# Patient Record
Sex: Female | Born: 1975 | Race: White | Hispanic: No | Marital: Married | Smoking: Former smoker
Health system: Southern US, Community
[De-identification: ages and names within clinical notes are randomized; demographics above are authoritative.]

## PROBLEM LIST (undated history)

## (undated) DIAGNOSIS — H6590 Unspecified nonsuppurative otitis media, unspecified ear: Secondary | ICD-10-CM

## (undated) DIAGNOSIS — H7290 Unspecified perforation of tympanic membrane, unspecified ear: Secondary | ICD-10-CM

## (undated) DIAGNOSIS — J9819 Other pulmonary collapse: Secondary | ICD-10-CM

## (undated) DIAGNOSIS — K219 Gastro-esophageal reflux disease without esophagitis: Secondary | ICD-10-CM

## (undated) DIAGNOSIS — IMO0001 Reserved for inherently not codable concepts without codable children: Secondary | ICD-10-CM

## (undated) DIAGNOSIS — M329 Systemic lupus erythematosus, unspecified: Secondary | ICD-10-CM

## (undated) DIAGNOSIS — M1990 Unspecified osteoarthritis, unspecified site: Secondary | ICD-10-CM

## (undated) HISTORY — DX: Systemic lupus erythematosus, unspecified: M32.9

## (undated) HISTORY — DX: Unspecified nonsuppurative otitis media, unspecified ear: H65.90

## (undated) HISTORY — DX: Unspecified perforation of tympanic membrane, unspecified ear: H72.90

## (undated) HISTORY — DX: Other pulmonary collapse: J98.19

## (undated) HISTORY — DX: Unspecified osteoarthritis, unspecified site: M19.90

## (undated) HISTORY — DX: Gastro-esophageal reflux disease without esophagitis: K21.9

## (undated) HISTORY — PX: TUBAL LIGATION: SHX77

## (undated) HISTORY — PX: CHEST TUBE INSERTION: SHX231

## (undated) HISTORY — DX: Reserved for inherently not codable concepts without codable children: IMO0001

## (undated) HISTORY — PX: WRIST SURGERY: SHX841

---

## 1998-01-16 ENCOUNTER — Emergency Department (HOSPITAL_COMMUNITY): Admission: EM | Admit: 1998-01-16 | Discharge: 1998-01-16 | Payer: Self-pay | Admitting: Emergency Medicine

## 2002-01-16 ENCOUNTER — Encounter (HOSPITAL_COMMUNITY): Admission: RE | Admit: 2002-01-16 | Discharge: 2002-02-15 | Payer: Self-pay | Admitting: Rheumatology

## 2002-06-26 DIAGNOSIS — M329 Systemic lupus erythematosus, unspecified: Secondary | ICD-10-CM

## 2002-06-26 DIAGNOSIS — IMO0002 Reserved for concepts with insufficient information to code with codable children: Secondary | ICD-10-CM

## 2002-06-26 HISTORY — DX: Systemic lupus erythematosus, unspecified: M32.9

## 2002-06-26 HISTORY — DX: Reserved for concepts with insufficient information to code with codable children: IMO0002

## 2004-09-20 ENCOUNTER — Emergency Department (HOSPITAL_COMMUNITY): Admission: EM | Admit: 2004-09-20 | Discharge: 2004-09-20 | Payer: Self-pay | Admitting: *Deleted

## 2009-06-26 HISTORY — PX: LUNG SURGERY: SHX703

## 2009-06-26 HISTORY — PX: OTHER SURGICAL HISTORY: SHX169

## 2009-09-02 ENCOUNTER — Inpatient Hospital Stay (HOSPITAL_COMMUNITY): Admission: AD | Admit: 2009-09-02 | Discharge: 2009-09-09 | Payer: Self-pay | Admitting: Thoracic Surgery

## 2009-09-03 ENCOUNTER — Encounter: Payer: Self-pay | Admitting: Thoracic Surgery

## 2009-09-03 ENCOUNTER — Ambulatory Visit: Payer: Self-pay | Admitting: Thoracic Surgery

## 2009-09-06 ENCOUNTER — Ambulatory Visit: Payer: Self-pay | Admitting: Thoracic Surgery

## 2009-09-15 ENCOUNTER — Encounter: Admission: RE | Admit: 2009-09-15 | Discharge: 2009-09-15 | Payer: Self-pay | Admitting: Thoracic Surgery

## 2009-09-15 ENCOUNTER — Ambulatory Visit: Payer: Self-pay | Admitting: Thoracic Surgery

## 2009-10-06 ENCOUNTER — Encounter: Admission: RE | Admit: 2009-10-06 | Discharge: 2009-10-06 | Payer: Self-pay | Admitting: Thoracic Surgery

## 2009-10-06 ENCOUNTER — Ambulatory Visit: Payer: Self-pay | Admitting: Thoracic Surgery

## 2009-10-27 ENCOUNTER — Ambulatory Visit: Payer: Self-pay | Admitting: Thoracic Surgery

## 2009-10-27 ENCOUNTER — Encounter: Admission: RE | Admit: 2009-10-27 | Discharge: 2009-10-27 | Payer: Self-pay | Admitting: Thoracic Surgery

## 2010-09-19 LAB — CBC
HCT: 22.5 % — ABNORMAL LOW (ref 36.0–46.0)
HCT: 22.7 % — ABNORMAL LOW (ref 36.0–46.0)
HCT: 38.8 % (ref 36.0–46.0)
Hemoglobin: 13.2 g/dL (ref 12.0–15.0)
Hemoglobin: 8.3 g/dL — ABNORMAL LOW (ref 12.0–15.0)
MCHC: 34 g/dL (ref 30.0–36.0)
MCHC: 34.3 g/dL (ref 30.0–36.0)
MCHC: 34.8 g/dL (ref 30.0–36.0)
MCHC: 34.8 g/dL (ref 30.0–36.0)
MCV: 91.4 fL (ref 78.0–100.0)
MCV: 91.6 fL (ref 78.0–100.0)
MCV: 91.9 fL (ref 78.0–100.0)
MCV: 92.1 fL (ref 78.0–100.0)
Platelets: 226 10*3/uL (ref 150–400)
Platelets: 241 10*3/uL (ref 150–400)
Platelets: 330 10*3/uL (ref 150–400)
RBC: 2.45 MIL/uL — ABNORMAL LOW (ref 3.87–5.11)
RBC: 2.62 MIL/uL — ABNORMAL LOW (ref 3.87–5.11)
RBC: 3.15 MIL/uL — ABNORMAL LOW (ref 3.87–5.11)
RDW: 12.4 % (ref 11.5–15.5)
RDW: 13 % (ref 11.5–15.5)
RDW: 13.1 % (ref 11.5–15.5)
RDW: 13.4 % (ref 11.5–15.5)
WBC: 6.5 10*3/uL (ref 4.0–10.5)

## 2010-09-19 LAB — POCT I-STAT 3, ART BLOOD GAS (G3+)
Patient temperature: 98.4
TCO2: 29 mmol/L (ref 0–100)
pCO2 arterial: 47 mmHg — ABNORMAL HIGH (ref 35.0–45.0)
pH, Arterial: 7.375 (ref 7.350–7.400)

## 2010-09-19 LAB — BASIC METABOLIC PANEL
BUN: 2 mg/dL — ABNORMAL LOW (ref 6–23)
CO2: 28 mEq/L (ref 19–32)
CO2: 28 mEq/L (ref 19–32)
CO2: 31 mEq/L (ref 19–32)
Calcium: 7.6 mg/dL — ABNORMAL LOW (ref 8.4–10.5)
Chloride: 103 mEq/L (ref 96–112)
Chloride: 97 mEq/L (ref 96–112)
Creatinine, Ser: 0.55 mg/dL (ref 0.4–1.2)
Creatinine, Ser: 0.66 mg/dL (ref 0.4–1.2)
GFR calc Af Amer: >60 mL/min (ref >60)
GFR calc Af Amer: >60 mL/min (ref >60)
GFR calc non Af Amer: >60 mL/min (ref >60)
Glucose, Bld: 141 mg/dL — ABNORMAL HIGH (ref 70–99)
Potassium: 3.8 mEq/L (ref 3.5–5.1)
Potassium: 4 mEq/L (ref 3.5–5.1)
Sodium: 137 mEq/L (ref 135–145)

## 2010-09-19 LAB — COMPREHENSIVE METABOLIC PANEL
Albumin: 3.4 g/dL — ABNORMAL LOW (ref 3.5–5.2)
BUN: 7 mg/dL (ref 6–23)
Calcium: 8.7 mg/dL (ref 8.4–10.5)
Creatinine, Ser: 0.86 mg/dL (ref 0.4–1.2)
Glucose, Bld: 104 mg/dL — ABNORMAL HIGH (ref 70–99)
Potassium: 3.9 mEq/L (ref 3.5–5.1)
Sodium: 137 mEq/L (ref 135–145)
Total Protein: 7.1 g/dL (ref 6.0–8.3)

## 2010-09-19 LAB — TYPE AND SCREEN

## 2010-09-19 LAB — HEPATIC FUNCTION PANEL
AST: 31 U/L (ref 0–37)
Albumin: 2 g/dL — ABNORMAL LOW (ref 3.5–5.2)
Alkaline Phosphatase: 61 U/L (ref 39–117)
Total Protein: 4.9 g/dL — ABNORMAL LOW (ref 6.0–8.3)

## 2010-09-19 LAB — MRSA PCR SCREENING
MRSA by PCR: NEGATIVE
MRSA by PCR: NEGATIVE

## 2010-09-19 LAB — PROTIME-INR: Prothrombin Time: 13.7 seconds (ref 11.6–15.2)

## 2010-11-08 NOTE — Assessment & Plan Note (Signed)
OFFICE VISIT   CHAVA, DULAC  DOB:  31-Jul-1975                                        Oct 27, 2009  CHART #:  [id number]   HISTORY:  The patient is a 35 year old white female who was admitted in  March 2011 with a spontaneous recurrent left-sided pneumothorax.  She  subsequently underwent a left video-assisted thorascopic surgery with  resection of apical bullae, pleurectomy, and mechanical pleurodesis.  She is seen on today's date in routine office visit followup.  Currently, she describes no significant new symptoms.  She has stopped  smoking as of this hospitalization and surgery.  She does occasionally  have cravings, but so far has resisted.  She denies any significant  shortness of breath.  Overall, she feels as though she is making good  progress.   DIAGNOSTIC TESTS:  A chest x-ray was obtained on today's date.  It  reveals a small area of left-sided apical hydropneumothorax probably  most consistent with the space.  There is no evidence of infiltrates,  effusions, or congestive failure.   PHYSICAL EXAMINATION:  Vital Signs:  Blood pressure is 109/67, pulse is  84, respirations 18, and oxygen saturation is 98% on room air.  General  Appearance:  A well-developed, young adult female, in no acute distress.  Pulmonary:  Clear breath sounds throughout.  Cardiac:  Regular rate and  rhythm without gallop or rub.  Incisions are inspected, well healed.  No  evidence of infection.   ASSESSMENT:  The patient continues to make excellent recovery.  She will  be seen at the office now on a p.r.n. basis.  I have encouraged her to  continue her efforts at smoking cessation, long-term.   Rowe Clack, P.A.-C.   Sherryll Burger  D:  10/27/2009  T:  10/27/2009  Job:  [id number]   cc:   Erskine Speed, M.D.  Donna Bernard, M.D.

## 2010-11-08 NOTE — Letter (Signed)
October 06, 2009   Donna Bernard, MD  [address]  [place], [place] [address]   Re:  Sierra Williams, Sierra Williams              DOB:  1976-03-23   Dear Dr. Gerda Diss:   I saw the patient back today, and her chest x-ray looks good and her  incisions are well healed.  We did an apical resection of apical bullae.  She is doing well overall.  Her blood pressure is 103/71, pulse 96,  respirations 18, sats were 99% on room.  I released her for full  activity, and we will see her back again in 6 weeks with a chest x-ray.   Ines Bloomer, M.D.  Electronically Signed   DPB/MEDQ  D:  10/06/2009  T:  10/07/2009  Job:  [id number]

## 2010-11-08 NOTE — Letter (Signed)
September 15, 2009   Donna Bernard, MD  [address]  [place], [place] [address]   Re:  AIJALON, DEMURO              DOB:  06/23/1976   Dear Brett Canales,   I saw the patient back today after we did a left VATS and resection of  apical blebs and pleurectomy.  Her incisions are well healed.  She is  having some moderate pain but this is improving.  Her blood pressure is  106/73, pulse 89, respirations 18, sats were 96%.  A chest x-ray showed  normal postoperative changes and lung expanded.  I will see her back  again in 3 weeks with a chest x-ray.   Ines Bloomer, M.D.  Electronically Signed   DPB/MEDQ  D:  09/15/2009  T:  09/16/2009  Job:  [id number]

## 2010-11-11 NOTE — Consult Note (Signed)
[facility]  Patient:    Sierra Williams, Sierra Williams Visit Number: [id number] MRN: [id number]          Service Type: RHE Location: [facility] Attending Physician:  Sierra Williams Dictated by:   Sierra Williams, M.D. Proc. Date: 01/16/02 Admit Date:  01/16/2002   CC:         Sierra Williams, M.D.  Sierra Williams, D.O.   Consultation Report  CHIEF COMPLAINT:  Joints swell and get stiff.  Dear Sierra Williams:  Thank you for this consultation. The patient is a 35 year old white female who reports that she has had problems with her joints for many years. Over the last year, she has worsened. Her main problem is pain in her knees. She describes this as a constant pain and occasionally they can swell. Her hands and feet will swell at night and her hands are quite stiff is she mows the yard holding on to the [employer]. Squatting also hurts her knees. Her ankles can be stiff. Her knees have actually hurt since the ninth grade when she was running in gym class. Walking can make them worse. She has a job where she walks, stands, and needs to squat throughout the day.  REVIEW OF SYSTEMS:  She denies fever, rashes, or significant weight loss. She did lose weight during the early part of her separation, but she has regained this weight. Her energy level is described as being okay and fair. There are frequent nights of tossing and turning and she can wake up unrested frequently. There has been no psoriasis, photosensitivity, oral ulcers, alopecia, pleurisy, or Raynauds. She has headache about once a week which can be "bad." There is no problem with diarrhea, constipation, blood, or mucus to the bowel movement. She denies chest pain, shortness of breath, neck, or back pain.  PAST MEDICAL/SURGICAL HISTORY:  No chronic illnesses. No surgeries.  CURRENT MEDICATIONS: 1. Birth control pill. 2. Aleve 220 mg when needed.  DRUG INTOLERANCES:  None.  FAMILY HISTORY:  Her father is 75 years of  age and is described as being in pretty good health. Her mother is 37 years of age and has problems with her thyroid and cholesterol.  SOCIAL HISTORY:  She has lived most of her life in [place]. She separated in September 2002. She has children ages 81 and 32 at home with her. She works at [employer]. She achieved a GED. She began smoking at about age 62 and smokes a pack of cigarettes a day. She has a few beers per month.  PHYSICAL EXAMINATION:  VITAL SIGNS:  Weight 127 pounds. Blood pressure 110/60, respirations 16, pulse 76.  GENERAL:  She is somewhat tired appearing.  SKIN:  Her skin already has some mild sallowness secondary to 10 years of smoking. She has slight acne.  HEENT:  Normal hair pattern. PERL/EOMI. Mouth clear with fairly good dentition.  NECK:  Slight upper cervical adenopathy. Normal palpable nontender thyroid.  LUNGS:  Clear.  HEART:  Regular, no murmur.  ABDOMEN:  Umbilicus ring. There is no tenderness or masses palpated.  MUSCULOSKELETAL:  Hands, wrists, elbows, shoulders, neck, back, hips, knees, ankles, and feet all have a painless full range of motion. They show no synovitis, particularly the knees. There is no joint line tenderness and the stability was excellent.  NEUROLOGIC:  Strength is 5/5. DTRs are 2+ throughout. Negative SLRs.  ASSESSMENT AND PLAN: 1. Polyarthralgia. This is not a true arthritic problem. She aches in    these joints. I suspect  that the long hours of standing and squatting    do contribute to the knees aching. It is difficult to give one medicine    to help with this. If someone is particularly strong through exercise such    as bicycling, running, then they are able to better withstand this    type of work. This will be hard for her because I suspect that she has    never exercised but I have recommended this for her. 2. Mild to moderate insomnia. I have suggested using Tylenol p.m. Her    trigger points were not  overly tender. There is some slight suggestion    this could be a fibromyalgia process. Other medicines typically used    such as Flexeril, Elavil, Trazodone certainly could be tried on a p.r.n.    basis. 3. Smoking. I have encouraged her to quit and strategies were discussed.  Sierra Williams, the patient has a normal musculoskeletal exam. I have stated to her that I do not feel that she needs x-rays at this time because I suspect hands and knees and feet x-rays would be normal. This is an aching process. It goes along with long hours of working along with deconditioning and the gradual insult that smoking brings on. This is not a full-blown fibromyalgia process but has tendencies towards that. If her symptoms worsen, then I will be glad to re-evaluate her and she will return on a p.r.n. basis. Dictated by:   Sierra Williams, M.D. Attending Physician:  Sierra Williams DD:  01/16/02 TD:  01/20/02 Job: [id number]

## 2011-05-29 IMAGING — CT CT CHEST W/ CM
2 of 4 series · 15 of 36 positions shown, 18 images · IV contrast (80ml omni 300)
Comparison: None.

CLINICAL DATA: Pneumothorax.  Chest pain.

CT CHEST WITH CONTRAST
TECHNIQUE: Multidetector CT imaging of the chest was performed
following the standard protocol during bolus administration of
intravenous contrast.
Contrast: 80 ml [id number]

[Series 2: routine chest · axial · 0.61mm/px · z∈[-246,-0]mm · 12 of 59 slices shown, 15 images]
[im 5/59  mediastinal]
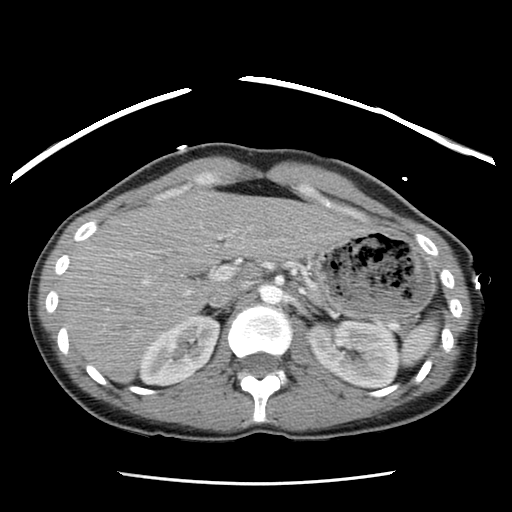
[im 5/59  lung]
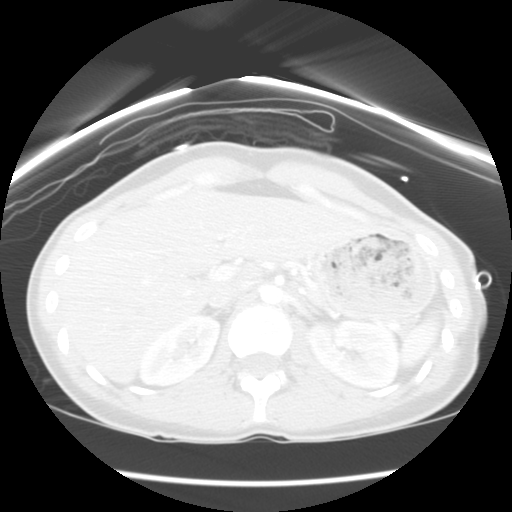
[im 9/59  lung]
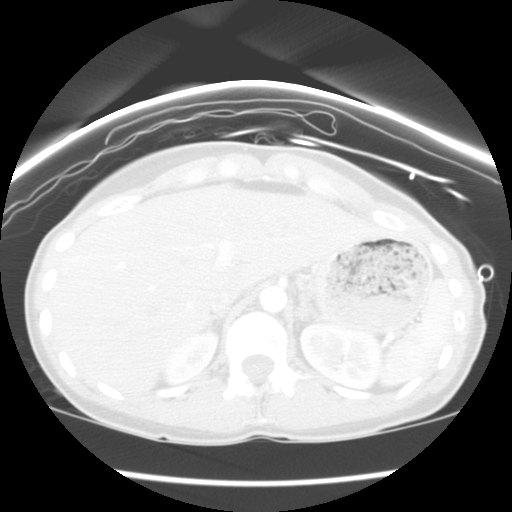
[im 13/59  lung]
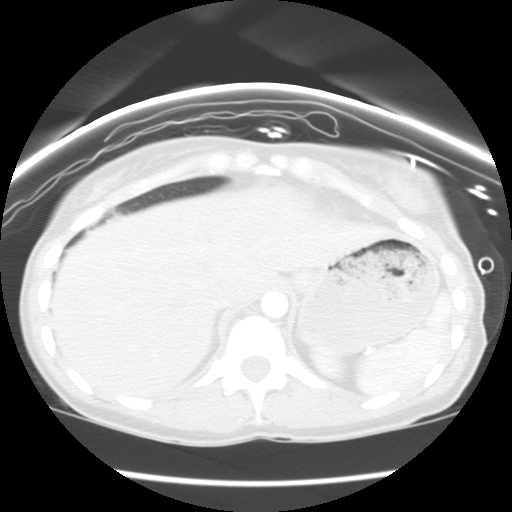
[im 17/59  lung]
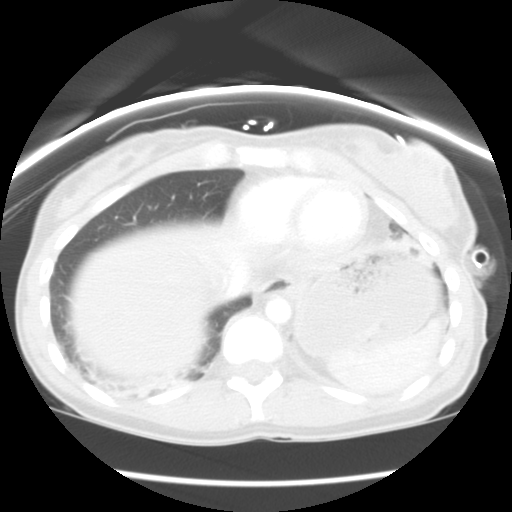
[im 21/59  mediastinal]
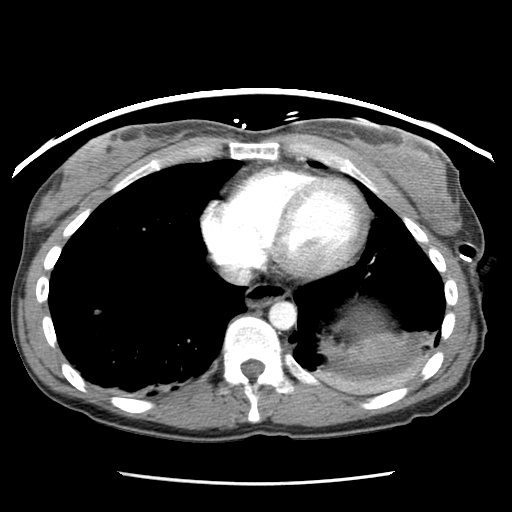
[im 21/59  lung]
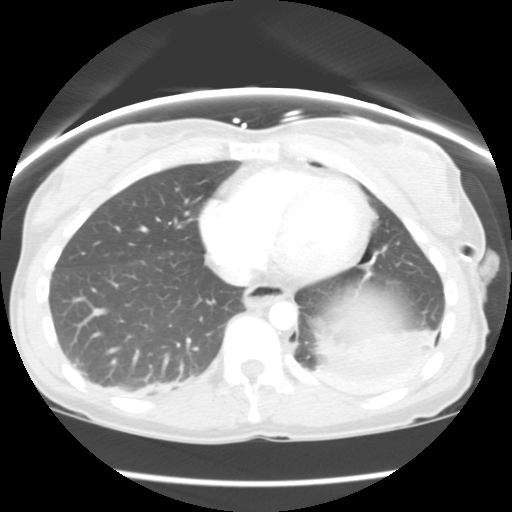
[im 25/59  lung]
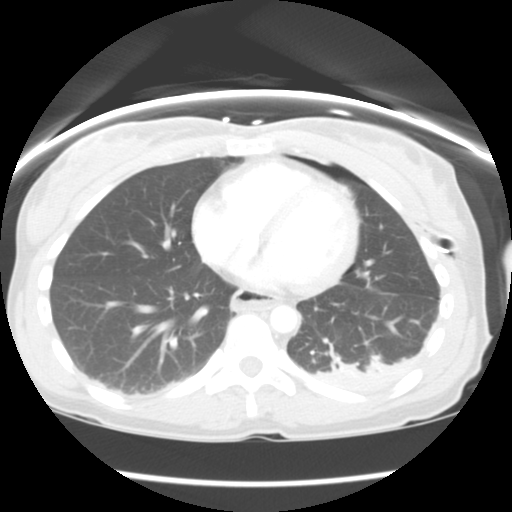
[im 34/59  lung]
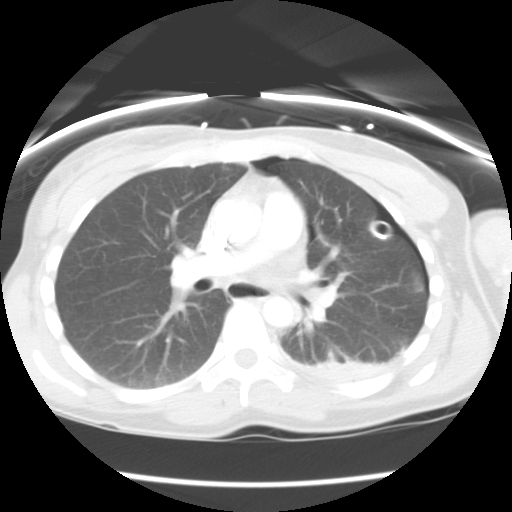
[im 38/59  lung]
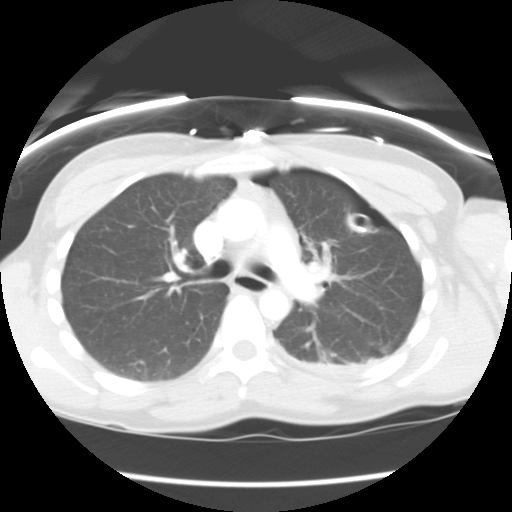
[im 42/59  mediastinal]
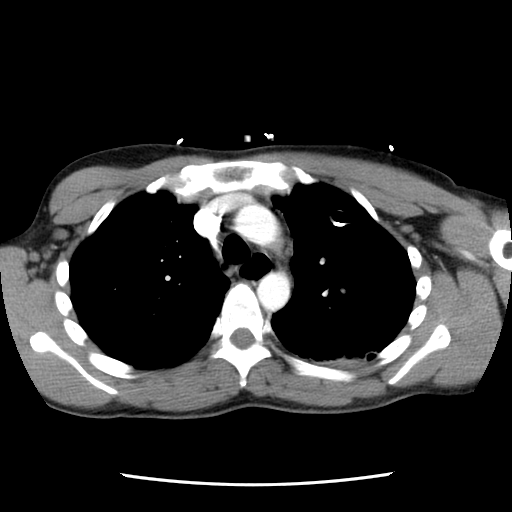
[im 42/59  lung]
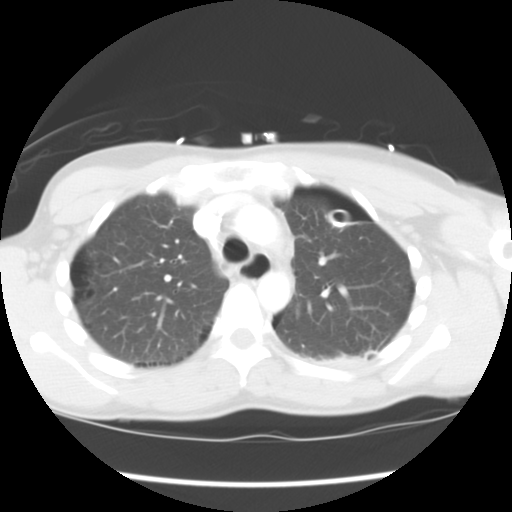
[im 46/59  lung]
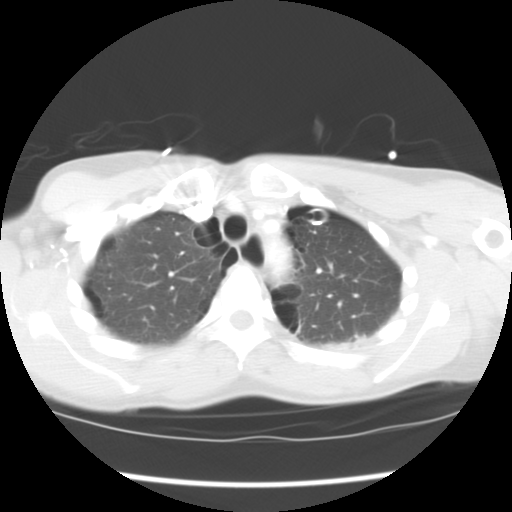
[im 50/59  lung]
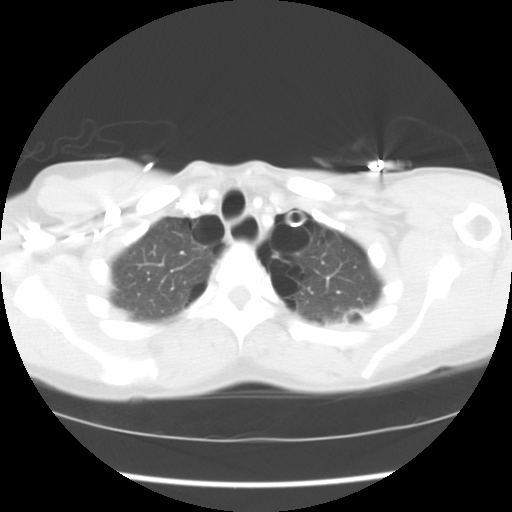
[im 54/59  lung]
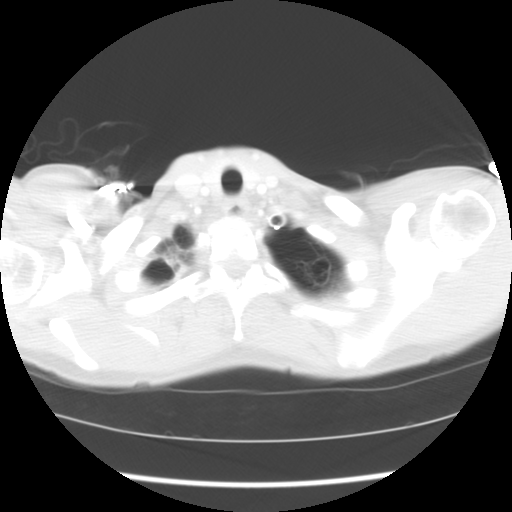

[Series 401: cor · coronal · 0.61mm/px · 3 of 71 slices shown]
[im 15/71  lung]
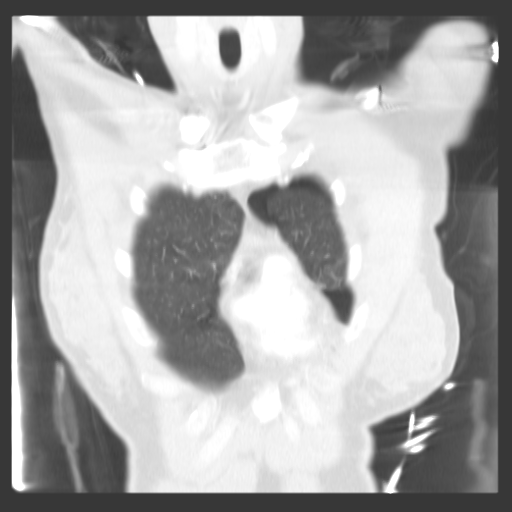
[im 29/71  lung]
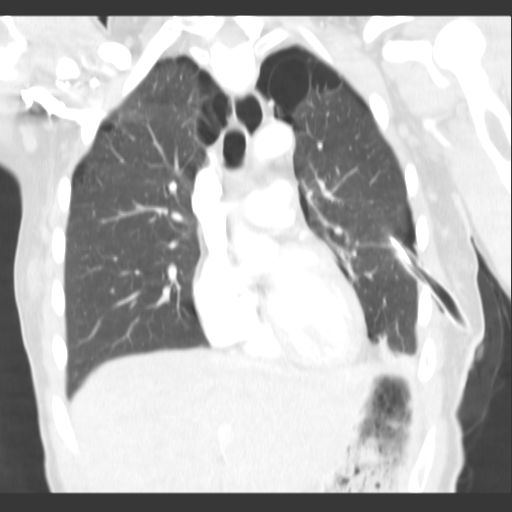
[im 43/71  lung]
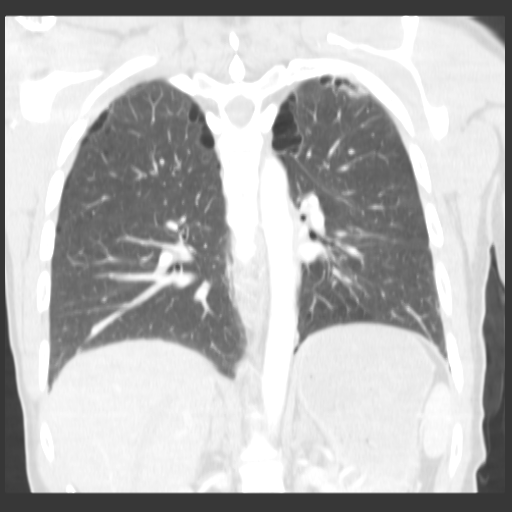

[15 of 36 positions shown; findings below may reference images not displayed]

FINDINGS: A left-sided chest tube enters the thorax between the
left fourth and fifth ribs and extends anteriorly to terminate at
the lung apex.  A 10% left pneumothorax is present.

Paraseptal emphysema is noted, most prominently at the lung apices.

There is dependent atelectasis in the left lung and in the
posterior basal segment right lower lobe.

The esophagus is mildly dilated and is gas filled.  No
pneumomediastinum.

Small bilateral axillary lymph nodes are present.  No pathologic
thoracic adenopathy noted.
IMPRESSION: 1.  10% left pneumothorax.  A left-sided chest tube is in place.
2.  Dependent atelectasis in the left lung.  There is a lesser
degree of dependent atelectasis in the posterior basal segment
right lower lobe.
3.  Paraseptal emphysema, advanced for age.
4.  Mild esophageal dilatation.

## 2011-05-29 IMAGING — CR DG CHEST 1V PORT
1 series · 1 of 1 positions shown · non-contrast
Comparison: Chest CT 09/02/2009.

CLINICAL DATA: Pneumothorax.  Chest tube placement.

PORTABLE CHEST - 1 VIEW

[view not recorded]
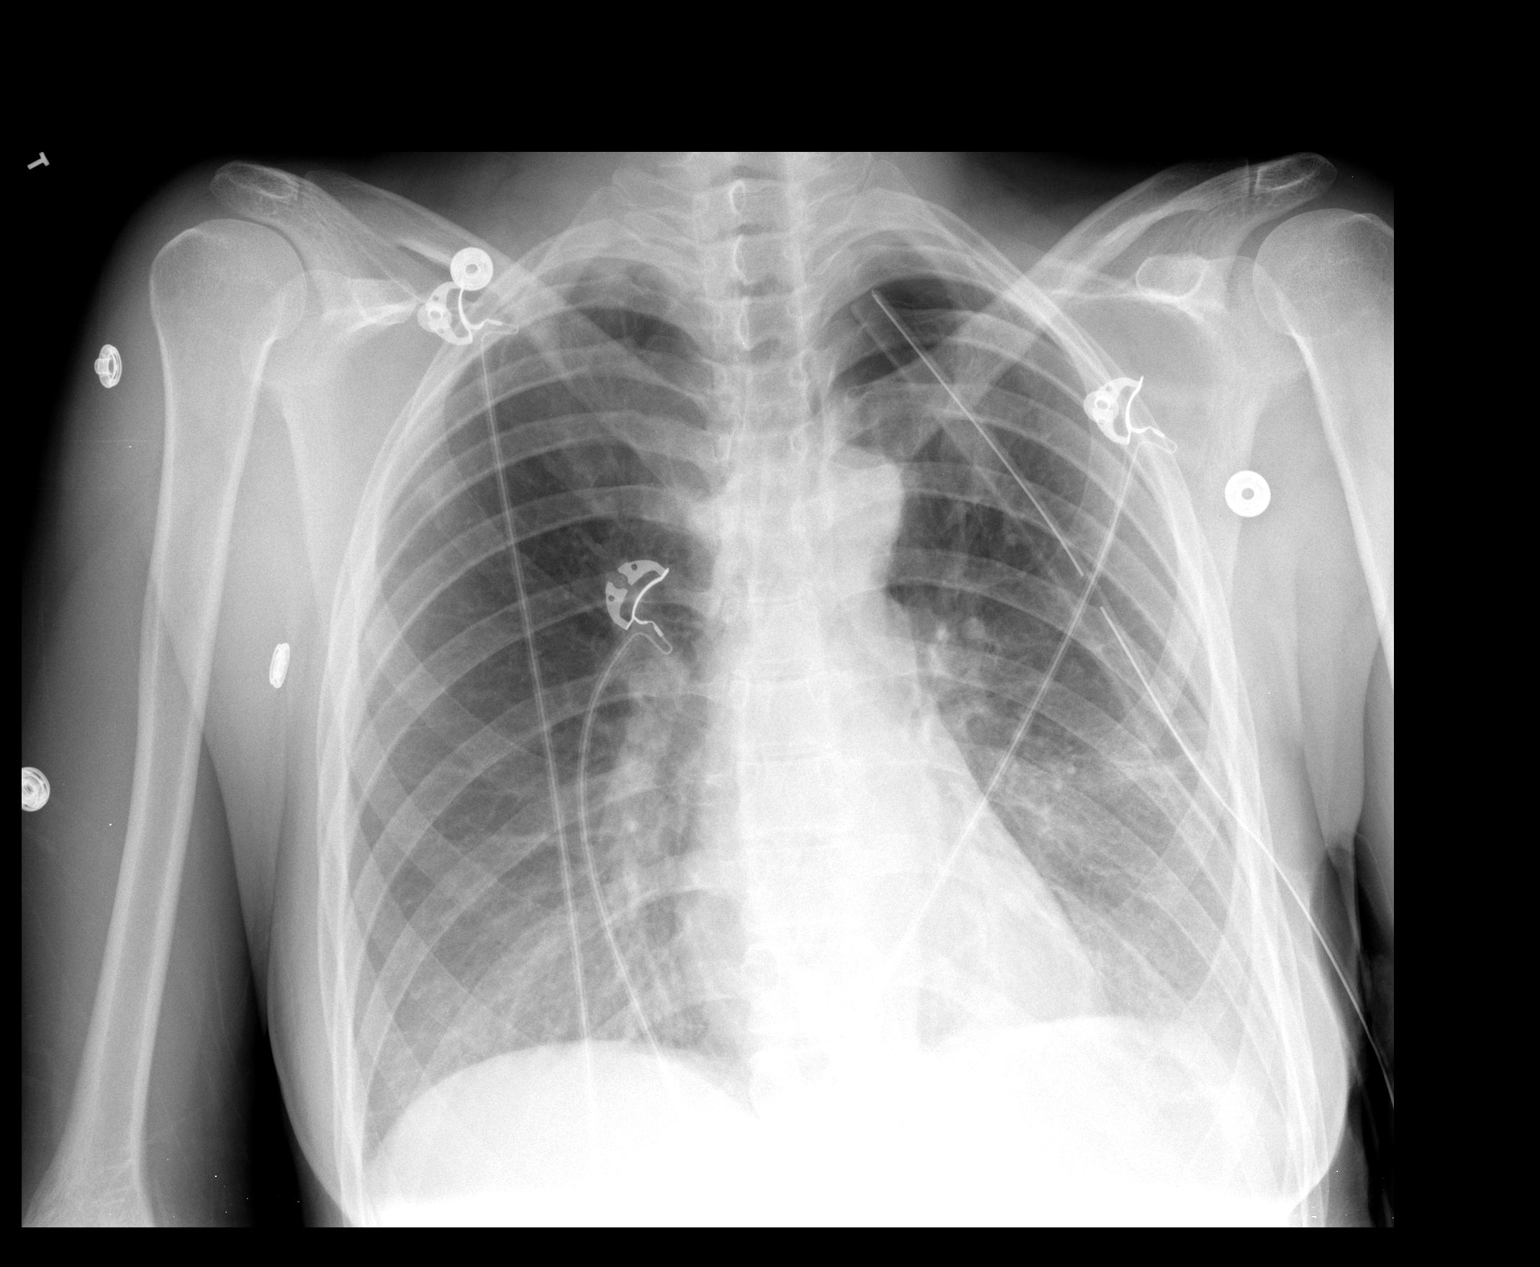

[1 of 1 positions shown; findings below may reference images not displayed]

FINDINGS: Left thoracostomy tube is present directed at the apex.
There may be a tiny left apical pneumothorax.  Tiny left apical
pneumothorax is present.  Left apical paraseptal emphysema makes
determination of the pleural line difficult.  The basilar
atelectasis is present.  Right lung appears unremarkable.
IMPRESSION: Left thoracostomy tube with tiny left apical pneumothorax.

## 2013-05-06 ENCOUNTER — Telehealth: Payer: Self-pay | Admitting: Family Medicine

## 2013-05-06 MED ORDER — GENTAMICIN SULFATE 0.3 % OP SOLN: 2.0000 [drp] | Freq: Four times a day (QID) | OPHTHALMIC | Status: AC

## 2013-05-06 NOTE — Telephone Encounter (Signed)
Patient has pink eye and would like something called in to [facility]

## 2013-05-06 NOTE — Telephone Encounter (Signed)
Garamycin two drops qid

## 2013-05-06 NOTE — Telephone Encounter (Signed)
Medication sent to pharmacy in Epic. Patient was notified.

## 2014-03-26 ENCOUNTER — Telehealth: Payer: Self-pay | Admitting: Family Medicine

## 2014-03-26 NOTE — Telephone Encounter (Signed)
2 weeks ago pt had a cold for 2 - 3 days. Fluid in ears, no ear pain, no fever, runny nose clear.

## 2014-03-26 NOTE — Telephone Encounter (Signed)
I rec ov, may use otc meds prn

## 2014-03-26 NOTE — Telephone Encounter (Signed)
Pt calling to have [place] call her in something for her ears, states she has had  This all her life, advised we can no longer just call in meds  Fluid behind her ears, advised pt that she has not been seen since  06/2012  For a sick visit   wal [facility]

## 2014-03-26 NOTE — Telephone Encounter (Signed)
Discussed with patient. Transferred to front to schedule office visit.  

## 2014-03-27 ENCOUNTER — Ambulatory Visit (INDEPENDENT_AMBULATORY_CARE_PROVIDER_SITE_OTHER): Payer: Managed Care, Other (non HMO) | Admitting: Family Medicine

## 2014-03-27 ENCOUNTER — Encounter: Payer: Self-pay | Admitting: Family Medicine

## 2014-03-27 VITALS — BP 102/64 | Temp 98.8°F | Ht 64.0 in | Wt 118.0 lb

## 2014-03-27 DIAGNOSIS — H6506 Acute serous otitis media, recurrent, bilateral: Secondary | ICD-10-CM

## 2014-03-27 MED ORDER — PANTOPRAZOLE SODIUM 40 MG PO TBEC: 40.0000 mg | DELAYED_RELEASE_TABLET | Freq: Every day | ORAL | Status: DC

## 2014-03-27 MED ORDER — AMOXICILLIN-POT CLAVULANATE 875-125 MG PO TABS: 1.0000 | ORAL_TABLET | Freq: Two times a day (BID) | ORAL | Status: AC

## 2014-03-27 NOTE — Progress Notes (Signed)
   Subjective:    Patient ID: Sierra Williams, female    DOB: July 20, 1975, 38 y.o.   MRN: [id number]  Ear Fullness  There is pain in both ears. The current episode started 1 to 4 weeks ago.   patient arrives with several concerns.  Two three wks ago sinuses and allergiew and cold  Lasted two three d, sounds muffled  Clears up if turns head certain way  No sig pain in ears  Day numb cell off [phone] or cell  [id number]  No sound ex  No hearing ck'ed  Ha frontal, dimoinshed with tyl  On further history patient has a long-standing history of this. It is frustrating her. Wonders if she can do anything about it. Years often become full and compromise hearing. Frequent ear infections as a child.  Hx of tm rupture over the years left ear once and right ear once  Patient notes ongoing reflux. Improvement taking medicines. Requests medication prescription.  Review of Systems No vomiting no diarrhea no headache no chest pain no abdominal pain no change in bowel habits    Objective:   Physical Exam  Alert mild malaise frontal tenderness TMs effusion evident bilateral pharynx normal neck supple lungs clear heart rare rhythm. Abdomen benign.     Assessment & Plan:  Impression #1 acute sinusitis with secondary effusion both ears. #2 History of recurrent effusion years. Patient would like ENT assessment #3 reflux also plan antibiotics prescribed. ENT referral. Rationale discussed. Protonic prescribed. Proper use discussed. WSL WSL

## 2014-04-23 ENCOUNTER — Ambulatory Visit (INDEPENDENT_AMBULATORY_CARE_PROVIDER_SITE_OTHER): Payer: Managed Care, Other (non HMO) | Admitting: Otolaryngology

## 2014-04-23 DIAGNOSIS — H6523 Chronic serous otitis media, bilateral: Secondary | ICD-10-CM

## 2014-04-23 DIAGNOSIS — H6983 Other specified disorders of Eustachian tube, bilateral: Secondary | ICD-10-CM

## 2014-04-23 DIAGNOSIS — H900 Conductive hearing loss, bilateral: Secondary | ICD-10-CM

## 2014-05-28 ENCOUNTER — Ambulatory Visit (INDEPENDENT_AMBULATORY_CARE_PROVIDER_SITE_OTHER): Payer: Managed Care, Other (non HMO) | Admitting: Otolaryngology

## 2014-05-28 DIAGNOSIS — H6983 Other specified disorders of Eustachian tube, bilateral: Secondary | ICD-10-CM

## 2014-05-28 DIAGNOSIS — H900 Conductive hearing loss, bilateral: Secondary | ICD-10-CM

## 2014-07-02 ENCOUNTER — Ambulatory Visit (INDEPENDENT_AMBULATORY_CARE_PROVIDER_SITE_OTHER): Payer: Managed Care, Other (non HMO) | Admitting: Otolaryngology

## 2014-07-02 DIAGNOSIS — H6983 Other specified disorders of Eustachian tube, bilateral: Secondary | ICD-10-CM

## 2014-07-02 DIAGNOSIS — H7201 Central perforation of tympanic membrane, right ear: Secondary | ICD-10-CM

## 2018-01-21 ENCOUNTER — Ambulatory Visit (INDEPENDENT_AMBULATORY_CARE_PROVIDER_SITE_OTHER): Payer: Managed Care, Other (non HMO) | Admitting: Nurse Practitioner

## 2018-01-21 ENCOUNTER — Encounter: Payer: Self-pay | Admitting: Nurse Practitioner

## 2018-01-21 VITALS — BP 102/72 | Ht 64.0 in | Wt 111.0 lb

## 2018-01-21 DIAGNOSIS — Z0001 Encounter for general adult medical examination with abnormal findings: Secondary | ICD-10-CM

## 2018-01-21 DIAGNOSIS — E041 Nontoxic single thyroid nodule: Secondary | ICD-10-CM | POA: Diagnosis not present

## 2018-01-21 DIAGNOSIS — Z1239 Encounter for other screening for malignant neoplasm of breast: Secondary | ICD-10-CM

## 2018-01-21 DIAGNOSIS — R5383 Other fatigue: Secondary | ICD-10-CM

## 2018-01-21 DIAGNOSIS — Z1231 Encounter for screening mammogram for malignant neoplasm of breast: Secondary | ICD-10-CM

## 2018-01-21 DIAGNOSIS — R1314 Dysphagia, pharyngoesophageal phase: Secondary | ICD-10-CM | POA: Diagnosis not present

## 2018-01-21 DIAGNOSIS — K219 Gastro-esophageal reflux disease without esophagitis: Secondary | ICD-10-CM | POA: Diagnosis not present

## 2018-01-21 DIAGNOSIS — Z0000 Encounter for general adult medical examination without abnormal findings: Secondary | ICD-10-CM

## 2018-01-21 MED ORDER — OMEPRAZOLE 40 MG PO CPDR: 40.0000 mg | DELAYED_RELEASE_CAPSULE | Freq: Every day | ORAL | 2 refills | Status: DC

## 2018-01-21 NOTE — Patient Instructions (Addendum)
Massage therapy Ice/heat  Icy Hot Smart Relief TENS unit Lidocaine patch   Food Choices for Gastroesophageal Reflux Disease, Adult When you have gastroesophageal reflux disease (GERD), the foods you eat and your eating habits are very important. Choosing the right foods can help ease your discomfort. What guidelines do I need to follow?  Choose fruits, vegetables, whole grains, and low-fat dairy products.  Choose low-fat meat, fish, and poultry.  Limit fats such as oils, salad dressings, butter, nuts, and avocado.  Keep a food diary. This helps you identify foods that cause symptoms.  Avoid foods that cause symptoms. These may be different for everyone.  Eat small meals often instead of 3 large meals a day.  Eat your meals slowly, in a place where you are relaxed.  Limit fried foods.  Cook foods using methods other than frying.  Avoid drinking alcohol.  Avoid drinking large amounts of liquids with your meals.  Avoid bending over or lying down until 2-3 hours after eating. What foods are not recommended? These are some foods and drinks that may make your symptoms worse: Vegetables Tomatoes. Tomato juice. Tomato and spaghetti sauce. Chili peppers. Onion and garlic. Horseradish. Fruits Oranges, grapefruit, and lemon (fruit and juice). Meats High-fat meats, fish, and poultry. This includes hot dogs, ribs, ham, sausage, salami, and bacon. Dairy Whole milk and chocolate milk. Sour cream. Cream. Butter. Ice cream. Cream cheese. Drinks Coffee and tea. Bubbly (carbonated) drinks or energy drinks. Condiments Hot sauce. Barbecue sauce. Sweets/Desserts Chocolate and cocoa. Donuts. Peppermint and spearmint. Fats and Oils High-fat foods. This includes [place] fries and potato chips. Other Vinegar. Strong spices. This includes black pepper, white pepper, red pepper, cayenne, curry powder, cloves, ginger, and chili powder. The items listed above may not be a complete list of foods  and drinks to avoid. Contact your dietitian for more information. This information is not intended to replace advice given to you by your health care provider. Make sure you discuss any questions you have with your health care provider. Document Released: 12/12/2011 Document Revised: 11/18/2015 Document Reviewed: 04/16/2013 Elsevier Interactive Patient Education  2017 [employer].

## 2018-01-22 ENCOUNTER — Encounter: Payer: Self-pay | Admitting: Nurse Practitioner

## 2018-01-22 LAB — CBC WITH DIFFERENTIAL/PLATELET
Basophils Absolute: 0 10*3/uL (ref 0.0–0.2)
Basos: 1 %
EOS (ABSOLUTE): 0.1 10*3/uL (ref 0.0–0.4)
EOS: 2 %
HEMATOCRIT: 41.9 % (ref 34.0–46.6)
HEMOGLOBIN: 13.9 g/dL (ref 11.1–15.9)
Immature Grans (Abs): 0 10*3/uL (ref 0.0–0.1)
Immature Granulocytes: 0 %
Lymphocytes Absolute: 0.6 10*3/uL — ABNORMAL LOW (ref 0.7–3.1)
Lymphs: 16 %
MCH: 29.4 pg (ref 26.6–33.0)
MCHC: 33.2 g/dL (ref 31.5–35.7)
MCV: 89 fL (ref 79–97)
MONOCYTES: 12 %
MONOS ABS: 0.4 10*3/uL (ref 0.1–0.9)
NEUTROS ABS: 2.6 10*3/uL (ref 1.4–7.0)
Neutrophils: 69 %
PLATELETS: 262 10*3/uL (ref 150–450)
RBC: 4.72 x10E6/uL (ref 3.77–5.28)
RDW: 13.4 % (ref 12.3–15.4)
WBC: 3.8 10*3/uL (ref 3.4–10.8)

## 2018-01-22 LAB — COMPREHENSIVE METABOLIC PANEL
A/G RATIO: 1.2 (ref 1.2–2.2)
ALK PHOS: 63 IU/L (ref 39–117)
ALT: 9 IU/L (ref 0–32)
AST: 13 IU/L (ref 0–40)
Albumin: 4.3 g/dL (ref 3.5–5.5)
BILIRUBIN TOTAL: 0.3 mg/dL (ref 0.0–1.2)
BUN / CREAT RATIO: 13 (ref 9–23)
BUN: 10 mg/dL (ref 6–24)
CHLORIDE: 103 mmol/L (ref 96–106)
CO2: 22 mmol/L (ref 20–29)
Calcium: 9.2 mg/dL (ref 8.7–10.2)
Creatinine, Ser: 0.79 mg/dL (ref 0.57–1.00)
GFR calc non Af Amer: 93 mL/min/{1.73_m2} (ref >59)
GFR, EST AFRICAN AMERICAN: 107 mL/min/{1.73_m2} (ref >59)
GLUCOSE: 90 mg/dL (ref 65–99)
Globulin, Total: 3.5 g/dL (ref 1.5–4.5)
POTASSIUM: 4.6 mmol/L (ref 3.5–5.2)
Sodium: 139 mmol/L (ref 134–144)
TOTAL PROTEIN: 7.8 g/dL (ref 6.0–8.5)

## 2018-01-22 LAB — TSH: TSH: 3.09 u[IU]/mL (ref 0.450–4.500)

## 2018-01-22 LAB — VITAMIN D 25 HYDROXY (VIT D DEFICIENCY, FRACTURES): Vit D, 25-Hydroxy: 44.2 ng/mL (ref 30.0–100.0)

## 2018-01-22 NOTE — Progress Notes (Signed)
Subjective: Presents for her wellness exam for work.  Gets regular gynecologic physicals.  Regular cycles heavy flow for 1 day lasting about 4 days total.  Same sexual partner.  Healthy diet.  Regular vision and dental exams.  Walks about 2-4 miles per day at her job.  Has a chronic history of reflux.  Nexium worked but had to stop this due to headache.  Pantoprazole does not seem to be working.  Would like to restart omeprazole which has worked well in the past.  Has had issues with heartburn for about 2 years.  Has been getting worse lately.  Occasionally feels like food gets stuck near the bottom of her throat does not occur all the time.  Usually foods like meat or bread.  Throws up after eating at least once a day, unassociated with any particular foods.   no sore throat.  No difficulty swallowing.  Drinks 3 cups of coffee in the morning, otherwise no caffeine.  Non-smoker.  Minimal alcohol intake.  Occasional NSAID use.  No abdominal pain.  No fevers.  Has occasional mild constipation, stools normal color.  Gets her lab work done through her employer, copy not available today.  States it is basic blood work including sugar and lipid profile.  Objective:   BP 102/72   Ht 5\' 4"  (1.626 m)   Wt 111 lb 0.4 oz (50.4 kg)   BMI 19.06 kg/m  NAD.  Alert, oriented.  TMs minimal clear effusion, no erythema.  Pharynx clear.  Neck supple with minimal adenopathy.  Lungs clear.  Heart regular rate and rhythm.  Abdomen soft nondistended nontender.  Thyroid nontender to palpation, a small firm nodule noted on the right side.  Assessment:   Problem List Items Addressed This Visit      Digestive   Gastroesophageal reflux disease without esophagitis   Relevant Medications   omeprazole (PRILOSEC) 40 MG capsule   Other Relevant Orders   Ambulatory referral to Gastroenterology   Pharyngoesophageal dysphagia   Relevant Orders   THYROID   Ambulatory referral to Gastroenterology     Endocrine   Thyroid nodule    Relevant Orders   TSH (Completed)   US THYROID    Other Visit Diagnoses    Annual physical exam    -  Primary   Fatigue, unspecified type       Relevant Orders   CBC with Differential/Platelet (Completed)   Comprehensive metabolic panel (Completed)   TSH (Completed)   VITAMIN D 25 Hydroxy (Vit-D Deficiency, Fractures) (Completed)   Screening for breast cancer       Relevant Orders   MM DIGITAL SCREENING BILATERAL       Plan:   Meds ordered this encounter  Medications  . omeprazole (PRILOSEC) 40 MG capsule    Sig: Take 1 capsule (40 mg total) by mouth daily.    Dispense:  30 capsule    Refill:  2    Order Specific Question:   Supervising Provider    Answer:   [place] [[id number]]   Switch to omeprazole.  Given written and verbal information on lifestyle factors affecting reflux disease.  Due to the chronicity of her problems as well as the worsening of her symptoms, refer to GI for consultation.  Patient to call back sooner if any problems.  Routine labs ordered. Return in about 1 year (around 01/22/2019) for physical. 25 minutes was spent with the patient.  This statement verifies that 25 minutes was indeed spent with  the patient.  More than 50% of this visit-total duration of the visit-was spent in counseling and coordination of care. The issues that the patient came in for today as reflected in the diagnosis (s) please refer to documentation for further details.

## 2018-01-24 ENCOUNTER — Encounter: Payer: Self-pay | Admitting: Family Medicine

## 2018-01-28 ENCOUNTER — Ambulatory Visit (HOSPITAL_COMMUNITY): Payer: Managed Care, Other (non HMO)

## 2018-01-31 ENCOUNTER — Encounter: Payer: Self-pay | Admitting: Gastroenterology

## 2018-02-01 ENCOUNTER — Ambulatory Visit (HOSPITAL_COMMUNITY)
Admission: RE | Admit: 2018-02-01 | Discharge: 2018-02-01 | Disposition: A | Discharge status: Home / Self Care | Payer: Managed Care, Other (non HMO) | Source: Ambulatory Visit | Attending: Nurse Practitioner | Admitting: Nurse Practitioner

## 2018-02-01 DIAGNOSIS — E041 Nontoxic single thyroid nodule: Secondary | ICD-10-CM | POA: Diagnosis present

## 2018-02-01 DIAGNOSIS — R1314 Dysphagia, pharyngoesophageal phase: Secondary | ICD-10-CM | POA: Diagnosis not present

## 2018-02-11 ENCOUNTER — Telehealth: Payer: Self-pay | Admitting: Family Medicine

## 2018-02-11 NOTE — Telephone Encounter (Signed)
Patient would like results of ultra sound of nec k/throat it was order by [employer].

## 2018-02-12 NOTE — Telephone Encounter (Signed)
Results discussed with patient. Patient advised per Dr Brett Canales: ultrasound showed two very tiny nodules on the thyroid-thyroid small and normal texture - current recommendation for one's these size is completely ok and no need for biopsy or referral. Patient verbalized understanding.

## 2018-02-12 NOTE — Telephone Encounter (Signed)
Notify pt two very tiney nodules on the thyroid small and nomrla texture -, current recommendation fo0r one's hthese size is completely ok and no need for biiopsy or referral

## 2018-03-01 ENCOUNTER — Other Ambulatory Visit: Payer: Self-pay

## 2018-03-01 ENCOUNTER — Encounter: Payer: Self-pay | Admitting: Gastroenterology

## 2018-03-01 ENCOUNTER — Ambulatory Visit (INDEPENDENT_AMBULATORY_CARE_PROVIDER_SITE_OTHER): Payer: Managed Care, Other (non HMO) | Admitting: Gastroenterology

## 2018-03-01 DIAGNOSIS — R1310 Dysphagia, unspecified: Secondary | ICD-10-CM

## 2018-03-01 DIAGNOSIS — R1319 Other dysphagia: Secondary | ICD-10-CM | POA: Insufficient documentation

## 2018-03-01 DIAGNOSIS — K219 Gastro-esophageal reflux disease without esophagitis: Secondary | ICD-10-CM | POA: Diagnosis not present

## 2018-03-01 NOTE — Progress Notes (Signed)
Primary Care Physician:  Merlyn Albert, MD  Primary Gastroenterologist:  Roetta Sessions, MD   Chief Complaint  Patient presents with  . Gastroesophageal Reflux    placed on omep and it has helped with her symptoms    HPI:  Sierra Williams is a 42 y.o. female here at the request of Sherie Don, NP for further evaluation of chronic GERD and dysphagia.  She tells me she has had acid reflux for years.  In the past she was on prescription medication but did come off at some time.  She would utilize over-the-counter regimens when needed.  Over the years her symptoms have been progressive.  Used to be intermittent heartburn mostly at night and over time has involved into frequent heartburn difficult to control with dietary measures, solid food dysphagia.  At times the only way she can get relief with the vomiting.  Would have vomiting at least once per week.  No hematemesis.  She was started on omeprazole 40 mg daily about 6 weeks ago.  She has noted significant improvement in her reflux symptoms.  Less issues swallowing.  If she misses a single dose she has significant recurrent symptoms.  She admits to utilizing Advil and/or Motrin for joint pain related to her lupus.  Regular BMs.  No melena or rectal bleeding.     Current Outpatient Medications  Medication Sig Dispense Refill  . Naproxen Sodium (ALEVE PO) Take by mouth as needed.    Marland Kitchen omeprazole (PRILOSEC) 40 MG capsule Take 1 capsule (40 mg total) by mouth daily. 30 capsule 2   No current facility-administered medications for this visit.     Allergies as of 03/01/2018  . (No Known Allergies)    Past Medical History:  Diagnosis Date  . Collapsed lung   . Lupus (HCC) 2004  . Otitis media, serous, TM rupture   . Reflux     Past Surgical History:  Procedure Laterality Date  . CHEST TUBE INSERTION    . left VATS for pneumothorax  2011  . TUBAL LIGATION      Family History  Problem Relation Age of Onset  . Hypertension  Maternal Grandmother   . Hyperlipidemia Maternal Grandmother   . Heart attack Maternal Grandfather   . Hypertension Maternal Grandfather   . Hyperlipidemia Maternal Grandfather   . Hypertension Paternal Grandmother   . Hyperlipidemia Paternal Grandmother   . Heart attack Paternal Grandfather   . Hypertension Paternal Grandfather   . Hyperlipidemia Paternal Grandfather   . Cancer Other        lung  . Cancer Other   . Melanoma Maternal Uncle   . Colon cancer Neg Hx     Social History   Socioeconomic History  . Marital status: Single    Spouse name: Not on file  . Number of children: Not on file  . Years of education: Not on file  . Highest education level: Not on file  Occupational History  . Not on file  Social Needs  . Financial resource strain: Not on file  . Food insecurity:    Worry: Not on file    Inability: Not on file  . Transportation needs:    Medical: Not on file    Non-medical: Not on file  Tobacco Use  . Smoking status: Former Games developer  . Smokeless tobacco: Former Neurosurgeon    Quit date: 03/27/2010  Substance and Sexual Activity  . Alcohol use: Yes    Comment: occas  . Drug use:  Never  . Sexual activity: Not on file  Lifestyle  . Physical activity:    Days per week: Not on file    Minutes per session: Not on file  . Stress: Not on file  Relationships  . Social connections:    Talks on phone: Not on file    Gets together: Not on file    Attends religious service: Not on file    Active member of club or organization: Not on file    Attends meetings of clubs or organizations: Not on file    Relationship status: Not on file  . Intimate partner violence:    Fear of current or ex partner: Not on file    Emotionally abused: Not on file    Physically abused: Not on file    Forced sexual activity: Not on file  Other Topics Concern  . Not on file  Social History Narrative  . Not on file      ROS:  General: Negative for anorexia, weight loss, fever,  chills, fatigue, weakness. Eyes: Negative for vision changes.  ENT: Negative for hoarseness, nasal congestion. See hpi CV: Negative for chest pain, angina, palpitations, dyspnea on exertion, peripheral edema.  Respiratory: Negative for dyspnea at rest, dyspnea on exertion, cough, sputum, wheezing.  GI: See history of present illness. GU:  Negative for dysuria, hematuria, urinary incontinence, urinary frequency, nocturnal urination.  MS: Negative for joint pain, low back pain.  Derm: Negative for rash or itching.  Neuro: Negative for weakness, abnormal sensation, seizure, frequent headaches, memory loss, confusion.  Psych: Negative for anxiety, depression, suicidal ideation, hallucinations.  Endo: Negative for unusual weight change.  Heme: Negative for bruising or bleeding. Allergy: Negative for rash or hives.    Physical Examination:  BP 105/66   Pulse 81   Temp 98.4 F (36.9 C) (Oral)   Ht 5\' 4"  (1.626 m)   Wt 116 lb (52.6 kg)   LMP 02/21/2018   BMI 19.91 kg/m    General: Well-nourished, well-developed in no acute distress.  Head: Normocephalic, atraumatic.   Eyes: Conjunctiva pink, no icterus. Mouth: Oropharyngeal mucosa moist and pink , no lesions erythema or exudate. Neck: Supple without thyromegaly, masses, or lymphadenopathy.  Lungs: Clear to auscultation bilaterally.  Heart: Regular rate and rhythm, no murmurs rubs or gallops.  Abdomen: Bowel sounds are normal, nontender, nondistended, no hepatosplenomegaly or masses, no abdominal bruits or hernia , no rebound or guarding.   Rectal: not performed Extremities: No lower extremity edema. No clubbing or deformities.  Neuro: Alert and oriented x 4 , grossly normal neurologically.  Skin: Warm and dry, no rash or jaundice.   Psych: Alert and cooperative, normal mood and affect.  Labs: Lab Results  Component Value Date   CREATININE 0.79 01/21/2018   BUN 10 01/21/2018   NA 139 01/21/2018   K 4.6 01/21/2018   CL 103  01/21/2018   CO2 22 01/21/2018   Lab Results  Component Value Date   ALT 9 01/21/2018   AST 13 01/21/2018   ALKPHOS 63 01/21/2018   BILITOT 0.3 01/21/2018   Lab Results  Component Value Date   WBC 3.8 01/21/2018   HGB 13.9 01/21/2018   HCT 41.9 01/21/2018   MCV 89 01/21/2018   PLT 262 01/21/2018   Lab Results  Component Value Date   TSH 3.090 01/21/2018     Imaging Studies: US Thyroid  Result Date: 02/01/2018 CLINICAL DATA:  Palpable right thyroid nodule, dysphagia EXAM: THYROID ULTRASOUND TECHNIQUE: Ultrasound  examination of the thyroid gland and adjacent soft tissues was performed. COMPARISON:  None. FINDINGS: Parenchymal Echotexture: Mildly heterogenous Isthmus: 0.2 cm thickness Right lobe: 4.7 x 1.3 x 1.3 cm Left lobe: 3.7 x 0.9 x 1.3 cm _________________________________________________________ Estimated total number of nodules >/= 1 cm: 0 Number of spongiform nodules >/=  2 cm not described below (TR1): 0 Number of mixed cystic and solid nodules >/= 1.5 cm not described below (TR2): 0 _________________________________________________________ 0.7 cm hyperechoic nodule without calcifications, mid right lobe. 0.5 cm hyperechoic nodule without calcifications, inferior right lobe. Neither meets criteria for biopsy or dedicated imaging follow-up. IMPRESSION: 1. Normal-sized thyroid with 2 small right nodules. Neither meets criteria for biopsy or dedicated imaging follow-up. The above is in keeping with the ACR TI-RADS recommendations - J Am Coll Radiol 2017;14:587-595. Electronically Signed   By: Corlis Leak M.D.   On: 02/01/2018 14:01

## 2018-03-01 NOTE — Assessment & Plan Note (Signed)
42 year old female with chronic progressive reflux, intermittent vomiting, solid food dysphagia.  Symptoms have improved on PPI therapy but 100%.  She does have quick relapse if she misses a dose of her medication.  Given the chronicity of her symptoms and the severity of her symptoms, we did discuss possibility of EGD with esophageal dilation in the near future.  We discussed options of barium pill esophagram stricture but not screen for Barrett's.  Would be beneficial with future management of her reflux to pursue endoscopic evaluation.  She is agreeable.  I have discussed the risks, alternatives, benefits with regards to but not limited to the risk of reaction to medication, bleeding, infection, perforation and the patient is agreeable to proceed. Written consent to be obtained.

## 2018-03-01 NOTE — Patient Instructions (Signed)
Upper endoscopy as scheduled. See separate instructions.   Continue omeprazole once daily before breakfast.    Gastroesophageal Reflux Disease, Adult Normally, food travels down the esophagus and stays in the stomach to be digested. However, when a person has gastroesophageal reflux disease (GERD), food and stomach acid move back up into the esophagus. When this happens, the esophagus becomes sore and inflamed. Over time, GERD can create small holes (ulcers) in the lining of the esophagus. What are the causes? This condition is caused by a problem with the muscle between the esophagus and the stomach (lower esophageal sphincter, or LES). Normally, the LES muscle closes after food passes through the esophagus to the stomach. When the LES is weakened or abnormal, it does not close properly, and that allows food and stomach acid to go back up into the esophagus. The LES can be weakened by certain dietary substances, medicines, and medical conditions, including:  Tobacco use.  Pregnancy.  Having a hiatal hernia.  Heavy alcohol use.  Certain foods and beverages, such as coffee, chocolate, onions, and peppermint.  What increases the risk? This condition is more likely to develop in:  People who have an increased body weight.  People who have connective tissue disorders.  People who use NSAID medicines.  What are the signs or symptoms? Symptoms of this condition include:  Heartburn.  Difficult or painful swallowing.  The feeling of having a lump in the throat.  Abitter taste in the mouth.  Bad breath.  Having a large amount of saliva.  Having an upset or bloated stomach.  Belching.  Chest pain.  Shortness of breath or wheezing.  Ongoing (chronic) cough or a night-time cough.  Wearing away of tooth enamel.  Weight loss.  Different conditions can cause chest pain. Make sure to see your health care provider if you experience chest pain. How is this diagnosed? Your  health care provider will take a medical history and perform a physical exam. To determine if you have mild or severe GERD, your health care provider may also monitor how you respond to treatment. You may also have other tests, including:  An endoscopy toexamine your stomach and esophagus with a small camera.  A test thatmeasures the acidity level in your esophagus.  A test thatmeasures how much pressure is on your esophagus.  A barium swallow or modified barium swallow to show the shape, size, and functioning of your esophagus.  How is this treated? The goal of treatment is to help relieve your symptoms and to prevent complications. Treatment for this condition may vary depending on how severe your symptoms are. Your health care provider may recommend:  Changes to your diet.  Medicine.  Surgery.  Follow these instructions at home: Diet  Follow a diet as recommended by your health care provider. This may involve avoiding foods and drinks such as: ? Coffee and tea (with or without caffeine). ? Drinks that containalcohol. ? Energy drinks and sports drinks. ? Carbonated drinks or sodas. ? Chocolate and cocoa. ? Peppermint and mint flavorings. ? Garlic and onions. ? Horseradish. ? Spicy and acidic foods, including peppers, chili powder, curry powder, vinegar, hot sauces, and barbecue sauce. ? Citrus fruit juices and citrus fruits, such as oranges, lemons, and limes. ? Tomato-based foods, such as red sauce, chili, salsa, and pizza with red sauce. ? Fried and fatty foods, such as donuts, french fries, potato chips, and high-fat dressings. ? High-fat meats, such as hot dogs and fatty cuts of red and white  meats, such as rib eye steak, sausage, ham, and bacon. ? High-fat dairy items, such as whole milk, butter, and cream cheese.  Eat small, frequent meals instead of large meals.  Avoid drinking large amounts of liquid with your meals.  Avoid eating meals during the 2-3 hours  before bedtime.  Avoid lying down right after you eat.  Do not exercise right after you eat. General instructions  Pay attention to any changes in your symptoms.  Take over-the-counter and prescription medicines only as told by your health care provider. Do not take aspirin, ibuprofen, or other NSAIDs unless your health care provider told you to do so.  Do not use any tobacco products, including cigarettes, chewing tobacco, and e-cigarettes. If you need help quitting, ask your health care provider.  Wear loose-fitting clothing. Do not wear anything tight around your waist that causes pressure on your abdomen.  Raise (elevate) the head of your bed 6 inches (15cm).  Try to reduce your stress, such as with yoga or meditation. If you need help reducing stress, ask your health care provider.  If you are overweight, reduce your weight to an amount that is healthy for you. Ask your health care provider for guidance about a safe weight loss goal.  Keep all follow-up visits as told by your health care provider. This is important. Contact a health care provider if:  You have new symptoms.  You have unexplained weight loss.  You have difficulty swallowing, or it hurts to swallow.  You have wheezing or a persistent cough.  Your symptoms do not improve with treatment.  You have a hoarse voice. Get help right away if:  You have pain in your arms, neck, jaw, teeth, or back.  You feel sweaty, dizzy, or light-headed.  You have chest pain or shortness of breath.  You vomit and your vomit looks like blood or coffee grounds.  You faint.  Your stool is bloody or black.  You cannot swallow, drink, or eat. This information is not intended to replace advice given to you by your health care provider. Make sure you discuss any questions you have with your health care provider. Document Released: 03/22/2005 Document Revised: 11/10/2015 Document Reviewed: 10/07/2014 Elsevier Interactive  Patient Education  [employer].

## 2018-03-04 NOTE — Progress Notes (Signed)
cc'ed to pcp °

## 2018-03-27 ENCOUNTER — Telehealth: Payer: Self-pay | Admitting: Internal Medicine

## 2018-03-27 NOTE — Telephone Encounter (Signed)
Pt called for MB. I told her MB was with another patient and she had tried calling and left her a VM. Please call her back.

## 2018-03-27 NOTE — Telephone Encounter (Signed)
Pt has questions about her insurance (PA) regarding her procedure scheduled on 10/16 with [facility]. [phone]

## 2018-03-27 NOTE — Telephone Encounter (Signed)
Tried to call pt, no answer, LMOVM. 

## 2018-03-28 NOTE — Telephone Encounter (Signed)
[employer] doesn't require PA for EGD/DIL. Called pt, she had spoke to her insurance company and they were asking her if she had "codes". Gave her CPT and diagnosis codes for her procedure.

## 2018-04-10 ENCOUNTER — Ambulatory Visit (HOSPITAL_COMMUNITY)
Admission: RE | Admit: 2018-04-10 | Discharge: 2018-04-10 | Disposition: A | Discharge status: Home / Self Care | Payer: Managed Care, Other (non HMO) | Source: Ambulatory Visit | Attending: Internal Medicine | Admitting: Internal Medicine

## 2018-04-10 ENCOUNTER — Other Ambulatory Visit: Payer: Self-pay

## 2018-04-10 ENCOUNTER — Encounter (HOSPITAL_COMMUNITY): Payer: Self-pay | Admitting: *Deleted

## 2018-04-10 ENCOUNTER — Encounter (HOSPITAL_COMMUNITY)
Admission: RE | Disposition: A | Discharge status: Home / Self Care | Payer: Self-pay | Source: Ambulatory Visit | Attending: Internal Medicine

## 2018-04-10 DIAGNOSIS — M329 Systemic lupus erythematosus, unspecified: Secondary | ICD-10-CM | POA: Insufficient documentation

## 2018-04-10 DIAGNOSIS — Z87891 Personal history of nicotine dependence: Secondary | ICD-10-CM | POA: Insufficient documentation

## 2018-04-10 DIAGNOSIS — Z801 Family history of malignant neoplasm of trachea, bronchus and lung: Secondary | ICD-10-CM | POA: Diagnosis not present

## 2018-04-10 DIAGNOSIS — R1319 Other dysphagia: Secondary | ICD-10-CM

## 2018-04-10 DIAGNOSIS — Z8249 Family history of ischemic heart disease and other diseases of the circulatory system: Secondary | ICD-10-CM | POA: Diagnosis not present

## 2018-04-10 DIAGNOSIS — Z79899 Other long term (current) drug therapy: Secondary | ICD-10-CM | POA: Insufficient documentation

## 2018-04-10 DIAGNOSIS — R1310 Dysphagia, unspecified: Secondary | ICD-10-CM | POA: Diagnosis present

## 2018-04-10 DIAGNOSIS — K219 Gastro-esophageal reflux disease without esophagitis: Secondary | ICD-10-CM | POA: Insufficient documentation

## 2018-04-10 DIAGNOSIS — K449 Diaphragmatic hernia without obstruction or gangrene: Secondary | ICD-10-CM | POA: Insufficient documentation

## 2018-04-10 DIAGNOSIS — Z808 Family history of malignant neoplasm of other organs or systems: Secondary | ICD-10-CM | POA: Insufficient documentation

## 2018-04-10 DIAGNOSIS — K2970 Gastritis, unspecified, without bleeding: Secondary | ICD-10-CM

## 2018-04-10 DIAGNOSIS — K3189 Other diseases of stomach and duodenum: Secondary | ICD-10-CM

## 2018-04-10 DIAGNOSIS — K228 Other specified diseases of esophagus: Secondary | ICD-10-CM | POA: Insufficient documentation

## 2018-04-10 DIAGNOSIS — K222 Esophageal obstruction: Secondary | ICD-10-CM | POA: Diagnosis not present

## 2018-04-10 HISTORY — PX: BIOPSY: SHX5522

## 2018-04-10 HISTORY — PX: ESOPHAGOGASTRODUODENOSCOPY: SHX5428

## 2018-04-10 HISTORY — PX: MALONEY DILATION: SHX5535

## 2018-04-10 SURGERY — EGD (ESOPHAGOGASTRODUODENOSCOPY)
Anesthesia: Moderate Sedation

## 2018-04-10 MED ORDER — LIDOCAINE VISCOUS HCL 2 % MT SOLN
OROMUCOSAL | Status: AC
  Filled 2018-04-10: qty 15

## 2018-04-10 MED ORDER — STERILE WATER FOR IRRIGATION IR SOLN
Status: DC | PRN
  Administered 2018-04-10: 1.5 mL

## 2018-04-10 MED ORDER — ONDANSETRON HCL 4 MG/2ML IJ SOLN
INTRAMUSCULAR | Status: DC | PRN
  Administered 2018-04-10: 4 mg via INTRAVENOUS

## 2018-04-10 MED ORDER — ONDANSETRON HCL 4 MG/2ML IJ SOLN
INTRAMUSCULAR | Status: AC
  Filled 2018-04-10: qty 2

## 2018-04-10 MED ORDER — SODIUM CHLORIDE 0.9 % IV SOLN
INTRAVENOUS | Status: DC
  Administered 2018-04-10: 1000 mL via INTRAVENOUS

## 2018-04-10 MED ORDER — MIDAZOLAM HCL 5 MG/5ML IJ SOLN
INTRAMUSCULAR | Status: DC | PRN
  Administered 2018-04-10: 1 mg via INTRAVENOUS
  Administered 2018-04-10 (×2): 2 mg via INTRAVENOUS

## 2018-04-10 MED ORDER — MIDAZOLAM HCL 5 MG/5ML IJ SOLN
INTRAMUSCULAR | Status: AC
  Filled 2018-04-10: qty 10

## 2018-04-10 MED ORDER — LIDOCAINE VISCOUS HCL 2 % MT SOLN
OROMUCOSAL | Status: DC | PRN
  Administered 2018-04-10: 1 via OROMUCOSAL

## 2018-04-10 MED ORDER — MEPERIDINE HCL 100 MG/ML IJ SOLN
INTRAMUSCULAR | Status: DC | PRN
  Administered 2018-04-10: 25 mg via INTRAVENOUS
  Administered 2018-04-10: 15 mg via INTRAVENOUS

## 2018-04-10 MED ORDER — MEPERIDINE HCL 50 MG/ML IJ SOLN
INTRAMUSCULAR | Status: AC
  Filled 2018-04-10: qty 1

## 2018-04-10 NOTE — Op Note (Signed)
[facility] Patient Name: Sierra Williams Procedure Date: 04/10/2018 10:32 AM MRN: [id number] Date of Birth: December 20, 1975 Attending MD: Gennette Pac , MD CSN: [id number] Age: 42 Admit Type: Outpatient Procedure:                Upper GI endoscopy Indications:              Dysphagia, Heartburn Providers:                Gennette Pac, MD, Nena Polio, RN, Edythe Clarity, Technician Referring MD:              Medicines:                Midazolam 3.5 mg IV, Meperidine 40 mg IV Complications:            No immediate complications. Estimated Blood Loss:     Estimated blood loss was minimal. Procedure:                Pre-Anesthesia Assessment:                           - Prior to the procedure, a History and Physical                            was performed, and patient medications and                            allergies were reviewed. The patient's tolerance of                            previous anesthesia was also reviewed. The risks                            and benefits of the procedure and the sedation                            options and risks were discussed with the patient.                            All questions were answered, and informed consent                            was obtained. Prior Anticoagulants: The patient has                            taken no previous anticoagulant or antiplatelet                            agents. ASA Grade Assessment: II - A patient with                            mild systemic disease. After reviewing the risks  and benefits, the patient was deemed in                            satisfactory condition to undergo the procedure.                           After obtaining informed consent, the endoscope was                            passed under direct vision. Throughout the                            procedure, the patient's blood pressure, pulse, and   oxygen saturations were monitored continuously. The                            [id number]-[id number] ([id number]) scope was introduced through the                            mouth, and advanced to the second part of duodenum.                            The upper GI endoscopy was accomplished without                            difficulty. The patient tolerated the procedure                            well. Scope In: 11:05:03 AM Scope Out: 11:14:49 AM Total Procedure Duration: 0 hours 9 minutes 46 seconds  Findings:      A mild Schatzki ring was found in the mid esophagus.      Salmon-colored mucosa was present - circumferentially 3 cm above the GE       junction. Ring present at proximal extent of this mucosal abnormality.       No esophagitis. No tumor. No nodularity.      A small hiatal hernia was present.      No other significant abnormalities were identified in a careful       examination of the stomach.      The duodenal bulb and second portion of the duodenum were normal. The       scope was withdrawn. Dilation was performed with a Maloney dilator with       mild resistance at 54 Fr. The dilation site was examined following       endoscope reinsertion and showed mild mucosal disruption. Estimated       blood loss was minimal. This was biopsied with a cold forceps for       histology at 31 and 33 cm from the incisors. Estimated blood loss was       minimal. Impression:               - Mild Schatzki ring. Dilated.                           - Salmon-colored mucosa suspicious for Barrett's  esophagus. Biopsied.                           - Small hiatal hernia.                           - Normal duodenal bulb and second portion of the                            duodenum. Moderate Sedation:      Moderate (conscious) sedation was administered by the endoscopy nurse       and supervised by the endoscopist. The following parameters were       monitored: oxygen saturation, heart  rate, blood pressure, and response       to care. Total physician intraservice time was 15 minutes. Recommendation:           - Patient has a contact number available for                            emergencies. The signs and symptoms of potential                            delayed complications were discussed with the                            patient. Return to normal activities tomorrow.                            Written discharge instructions were provided to the                            patient.                           - Advance diet as tolerated.                           - Continue present medications.                           - Repeat upper endoscopy after studies are complete                            for surveillance.                           - Return to GI clinic in 3 months. Procedure Code(s):        --- Professional ---                           [id number], Esophagogastroduodenoscopy, flexible,                            transoral; with biopsy, single or multiple                           43450, Dilation of esophagus,  by unguided sound or                            bougie, single or multiple passes                           G0500, Moderate sedation services provided by the                            same physician or other qualified health care                            professional performing a gastrointestinal                            endoscopic service that sedation supports,                            requiring the presence of an independent trained                            observer to assist in the monitoring of the                            patient's level of consciousness and physiological                            status; initial 15 minutes of intra-service time;                            patient age 5 years or older (additional time may                            be reported with 16109, as appropriate) Diagnosis Code(s):        --- Professional ---                            K22.2, Esophageal obstruction                           K22.8, Other specified diseases of esophagus                           K44.9, Diaphragmatic hernia without obstruction or                            gangrene                           R13.10, Dysphagia, unspecified                           R12, Heartburn CPT copyright 2018 American Medical Association. All rights reserved. The codes documented in this report are preliminary and upon coder review may  be revised to meet current compliance requirements. Sierra Williams. Sierra Gauss, MD Gennette Pac, MD  04/10/2018 11:28:08 AM This report has been signed electronically. Number of Addenda: 0

## 2018-04-10 NOTE — Discharge Instructions (Signed)
EGD Discharge instructions Please read the instructions outlined below and refer to this sheet in the next few weeks. These discharge instructions provide you with general information on caring for yourself after you leave the hospital. Your doctor may also give you specific instructions. While your treatment has been planned according to the most current medical practices available, unavoidable complications occasionally occur. If you have any problems or questions after discharge, please call your doctor. ACTIVITY  You may resume your regular activity but move at a slower pace for the next 24 hours.   Take frequent rest periods for the next 24 hours.   Walking will help expel (get rid of) the air and reduce the bloated feeling in your abdomen.   No driving for 24 hours (because of the anesthesia (medicine) used during the test).   You may shower.   Do not sign any important legal documents or operate any machinery for 24 hours (because of the anesthesia used during the test).  NUTRITION  Drink plenty of fluids.   You may resume your normal diet.   Begin with a light meal and progress to your normal diet.   Avoid alcoholic beverages for 24 hours or as instructed by your caregiver.  MEDICATIONS  You may resume your normal medications unless your caregiver tells you otherwise.  WHAT YOU CAN EXPECT TODAY  You may experience abdominal discomfort such as a feeling of fullness or gas pains.  FOLLOW-UP  Your doctor will discuss the results of your test with you.  SEEK IMMEDIATE MEDICAL ATTENTION IF ANY OF THE FOLLOWING OCCUR:  Excessive nausea (feeling sick to your stomach) and/or vomiting.   Severe abdominal pain and distention (swelling).   Trouble swallowing.   Temperature over 101 F (37.8 C).   Rectal bleeding or vomiting of blood.   GERD information provided  Continue omeprazole 40 mg daily  Further recommendations to follow pending review of pathology  report  Office visit with [place] in 3 months   Gastroesophageal Reflux Disease, Adult Normally, food travels down the esophagus and stays in the stomach to be digested. However, when a person has gastroesophageal reflux disease (GERD), food and stomach acid move back up into the esophagus. When this happens, the esophagus becomes sore and inflamed. Over time, GERD can create small holes (ulcers) in the lining of the esophagus. What are the causes? This condition is caused by a problem with the muscle between the esophagus and the stomach (lower esophageal sphincter, or LES). Normally, the LES muscle closes after food passes through the esophagus to the stomach. When the LES is weakened or abnormal, it does not close properly, and that allows food and stomach acid to go back up into the esophagus. The LES can be weakened by certain dietary substances, medicines, and medical conditions, including:  Tobacco use.  Pregnancy.  Having a hiatal hernia.  Heavy alcohol use.  Certain foods and beverages, such as coffee, chocolate, onions, and peppermint.  What increases the risk? This condition is more likely to develop in:  People who have an increased body weight.  People who have connective tissue disorders.  People who use NSAID medicines.  What are the signs or symptoms? Symptoms of this condition include:  Heartburn.  Difficult or painful swallowing.  The feeling of having a lump in the throat.  Abitter taste in the mouth.  Bad breath.  Having a large amount of saliva.  Having an upset or bloated stomach.  Belching.  Chest pain.  Shortness  of breath or wheezing.  Ongoing (chronic) cough or a night-time cough.  Wearing away of tooth enamel.  Weight loss.  Different conditions can cause chest pain. Make sure to see your health care provider if you experience chest pain. How is this diagnosed? Your health care provider will take a medical history and perform a  physical exam. To determine if you have mild or severe GERD, your health care provider may also monitor how you respond to treatment. You may also have other tests, including:  An endoscopy toexamine your stomach and esophagus with a small camera.  A test thatmeasures the acidity level in your esophagus.  A test thatmeasures how much pressure is on your esophagus.  A barium swallow or modified barium swallow to show the shape, size, and functioning of your esophagus.  How is this treated? The goal of treatment is to help relieve your symptoms and to prevent complications. Treatment for this condition may vary depending on how severe your symptoms are. Your health care provider may recommend:  Changes to your diet.  Medicine.  Surgery.  Follow these instructions at home: Diet  Follow a diet as recommended by your health care provider. This may involve avoiding foods and drinks such as: ? Coffee and tea (with or without caffeine). ? Drinks that containalcohol. ? Energy drinks and sports drinks. ? Carbonated drinks or sodas. ? Chocolate and cocoa. ? Peppermint and mint flavorings. ? Garlic and onions. ? Horseradish. ? Spicy and acidic foods, including peppers, chili powder, curry powder, vinegar, hot sauces, and barbecue sauce. ? Citrus fruit juices and citrus fruits, such as oranges, lemons, and limes. ? Tomato-based foods, such as red sauce, chili, salsa, and pizza with red sauce. ? Fried and fatty foods, such as donuts, french fries, potato chips, and high-fat dressings. ? High-fat meats, such as hot dogs and fatty cuts of red and white meats, such as rib eye steak, sausage, ham, and bacon. ? High-fat dairy items, such as whole milk, butter, and cream cheese.  Eat small, frequent meals instead of large meals.  Avoid drinking large amounts of liquid with your meals.  Avoid eating meals during the 2-3 hours before bedtime.  Avoid lying down right after you eat.  Do not  exercise right after you eat. General instructions  Pay attention to any changes in your symptoms.  Take over-the-counter and prescription medicines only as told by your health care provider. Do not take aspirin, ibuprofen, or other NSAIDs unless your health care provider told you to do so.  Do not use any tobacco products, including cigarettes, chewing tobacco, and e-cigarettes. If you need help quitting, ask your health care provider.  Wear loose-fitting clothing. Do not wear anything tight around your waist that causes pressure on your abdomen.  Raise (elevate) the head of your bed 6 inches (15cm).  Try to reduce your stress, such as with yoga or meditation. If you need help reducing stress, ask your health care provider.  If you are overweight, reduce your weight to an amount that is healthy for you. Ask your health care provider for guidance about a safe weight loss goal.  Keep all follow-up visits as told by your health care provider. This is important. Contact a health care provider if:  You have new symptoms.  You have unexplained weight loss.  You have difficulty swallowing, or it hurts to swallow.  You have wheezing or a persistent cough.  Your symptoms do not improve with treatment.  You have  a hoarse voice. Get help right away if:  You have pain in your arms, neck, jaw, teeth, or back.  You feel sweaty, dizzy, or light-headed.  You have chest pain or shortness of breath.  You vomit and your vomit looks like blood or coffee grounds.  You faint.  Your stool is bloody or black.  You cannot swallow, drink, or eat. This information is not intended to replace advice given to you by your health care provider. Make sure you discuss any questions you have with your health care provider. Document Released: 03/22/2005 Document Revised: 11/10/2015 Document Reviewed: 10/07/2014 Elsevier Interactive Patient Education  [employer].

## 2018-04-10 NOTE — H&P (Signed)
@LOGO @   Primary Care Physician:  , MD Primary Gastroenterologist:  Dr. Merlyn Albert  Pre-Procedure History & Physical: HPI:  Sierra Williams is a 43 y.o. female here for further evaluation of long-standing GERD and dysphagia.  Past Medical History:  Diagnosis Date  . Collapsed lung   . Lupus (HCC) 2004  . Otitis media, serous, TM rupture   . Reflux     Past Surgical History:  Procedure Laterality Date  . CHEST TUBE INSERTION    . left VATS for pneumothorax  2011  . LUNG SURGERY Left 2011  . TUBAL LIGATION    . WRIST SURGERY Right     Prior to Admission medications   Medication Sig Start Date End Date Taking? Authorizing Provider  omeprazole (PRILOSEC) 40 MG capsule Take 1 capsule (40 mg total) by mouth daily. 01/21/18  Yes 01/23/18, NP    Allergies as of 03/01/2018  . (No Known Allergies)    Family History  Problem Relation Age of Onset  . Hypertension Maternal Grandmother   . Hyperlipidemia Maternal Grandmother   . Heart attack Maternal Grandfather   . Hypertension Maternal Grandfather   . Hyperlipidemia Maternal Grandfather   . Hypertension Paternal Grandmother   . Hyperlipidemia Paternal Grandmother   . Heart attack Paternal Grandfather   . Hypertension Paternal Grandfather   . Hyperlipidemia Paternal Grandfather   . Cancer Other        lung  . Cancer Other   . Melanoma Maternal Uncle   . Colon cancer Neg Hx     Social History   Socioeconomic History  . Marital status: Single    Spouse name: Not on file  . Number of children: Not on file  . Years of education: Not on file  . Highest education level: Not on file  Occupational History  . Not on file  Social Needs  . Financial resource strain: Not on file  . Food insecurity:    Worry: Not on file    Inability: Not on file  . Transportation needs:    Medical: Not on file    Non-medical: Not on file  Tobacco Use  . Smoking status: Former 05/01/2018  . Smokeless tobacco: Former  Games developer    Quit date: 03/27/2010  Substance and Sexual Activity  . Alcohol use: Yes    Comment: occas  . Drug use: Never  . Sexual activity: Not on file  Lifestyle  . Physical activity:    Days per week: Not on file    Minutes per session: Not on file  . Stress: Not on file  Relationships  . Social connections:    Talks on phone: Not on file    Gets together: Not on file    Attends religious service: Not on file    Active member of club or organization: Not on file    Attends meetings of clubs or organizations: Not on file    Relationship status: Not on file  . Intimate partner violence:    Fear of current or ex partner: Not on file    Emotionally abused: Not on file    Physically abused: Not on file    Forced sexual activity: Not on file  Other Topics Concern  . Not on file  Social History Narrative  . Not on file    Review of Systems: See HPI, otherwise negative ROS  Physical Exam: BP 108/75   Pulse 90   Temp 98.7 F (37.1 C) (Oral)  Resp 18   Ht 5\' 4"  (1.626 m)   Wt 52.2 kg   LMP 04/10/2018 (Exact Date)   SpO2 100%   BMI 19.74 kg/m  General:   Alert,  Well-developed, well-nourished, pleasant and cooperative in NAD Neck:  Supple; no masses or thyromegaly. No significant cervical adenopathy. Lungs:  Clear throughout to auscultation.   No wheezes, crackles, or rhonchi. No acute distress. Heart:  Regular rate and rhythm; no murmurs, clicks, rubs,  or gallops. Abdomen: Non-distended, normal bowel sounds.  Soft and nontender without appreciable mass or hepatosplenomegaly.  Pulses:  Normal pulses noted. Extremities:  Without clubbing or edema.  Impression/Plan: 42 year old lady with long-standing GERD and esophageal dysphagia.  EGD with esophageal dilation is feasible/appropriate today per plan. The risks, benefits, limitations, alternatives and imponderables have been reviewed with the patient. Potential for esophageal dilation, biopsy, etc. have also been reviewed.   Questions have been answered. All parties agreeable.     Notice: This dictation was prepared with Dragon dictation along with smaller phrase technology. Any transcriptional errors that result from this process are unintentional and may not be corrected upon review.

## 2018-04-16 ENCOUNTER — Encounter (HOSPITAL_COMMUNITY): Payer: Self-pay | Admitting: Internal Medicine

## 2018-04-16 ENCOUNTER — Encounter: Payer: Self-pay | Admitting: Internal Medicine

## 2018-04-17 ENCOUNTER — Ambulatory Visit: Payer: Managed Care, Other (non HMO) | Admitting: Nurse Practitioner

## 2018-05-06 ENCOUNTER — Telehealth: Payer: Self-pay | Admitting: Internal Medicine

## 2018-05-06 MED ORDER — OMEPRAZOLE 40 MG PO CPDR: 40.0000 mg | DELAYED_RELEASE_CAPSULE | Freq: Every day | ORAL | 3 refills | Status: DC

## 2018-05-06 NOTE — Telephone Encounter (Signed)
Routing message 

## 2018-05-06 NOTE — Telephone Encounter (Signed)
Pt said she needed a prescription of Omeprazole 40 mg called into [facility] in [place]. She usually gets this prescription from her PCP, but said that [facility] told her to call when she needed a refill and we would call it in. Please call [phone]

## 2018-05-06 NOTE — Telephone Encounter (Signed)
Routing to [facility] refill. See last ov noted 02/2018.

## 2018-05-06 NOTE — Telephone Encounter (Signed)
Done

## 2018-07-17 ENCOUNTER — Ambulatory Visit (INDEPENDENT_AMBULATORY_CARE_PROVIDER_SITE_OTHER): Payer: Managed Care, Other (non HMO) | Admitting: Gastroenterology

## 2018-07-17 ENCOUNTER — Encounter: Payer: Self-pay | Admitting: Gastroenterology

## 2018-07-17 VITALS — BP 107/68 | HR 90 | Temp 97.5°F | Ht 64.0 in | Wt 115.0 lb

## 2018-07-17 DIAGNOSIS — K2100 Gastro-esophageal reflux disease with esophagitis, without bleeding: Secondary | ICD-10-CM

## 2018-07-17 NOTE — Patient Instructions (Signed)
Continue omeprazole once daily before breakfast.   We will see you back in one year or sooner if needed.

## 2018-07-17 NOTE — Assessment & Plan Note (Addendum)
Clinically doing well.  Dysphagia resolved.  Plans to continue omeprazole 40 mg daily.  Reinforced antireflux measures.  We will have her come back in 1 year or call sooner if needed.  Will discuss further with Dr. Jena Gauss regarding path finding and if patient will require future surveillance endoscopies.

## 2018-07-17 NOTE — Progress Notes (Signed)
cc'ed to pcp °

## 2018-07-17 NOTE — Progress Notes (Signed)
      Primary Care Physician: Merlyn Albert, MD  Primary Gastroenterologist:  Roetta Sessions, MD   Chief Complaint  Patient presents with  . Dysphagia    pp f/u. Doing fine    HPI: Sierra Williams is a 43 y.o. female here for follow-up of GERD and dysphagia.  She was seen back in September.  She had an EGD however 2019 and found to have mild Schatzki ring in the mid esophagus, status post dilation.  Salmon-colored mucosa present circumferentially 3 cm above the GE junction.  Small hiatal hernia.  Biopsies consistent with mild chronic inflammation, reactive changes.  Intestinal metaplasia present, negative for dysplasia.  Gastric mucosa with mild inflammation.  In the appropriate clinical context the histological findings are compatible with Barrett's.   Clinically she is doing much better.  No further dysphagia.  Reflux is well controlled.  She is occasionally forgot her medication and has had terrible heartburn.  As long as she takes that she feels well.  No bowel concerns.  No blood in the stool or melena.  No abdominal pain.  Current Outpatient Medications  Medication Sig Dispense Refill  . omeprazole (PRILOSEC) 40 MG capsule Take 1 capsule (40 mg total) by mouth daily. 90 capsule 3   No current facility-administered medications for this visit.     Allergies as of 07/17/2018  . (No Known Allergies)    ROS:  General: Negative for anorexia, weight loss, fever, chills, fatigue, weakness. ENT: Negative for hoarseness, difficulty swallowing , nasal congestion. CV: Negative for chest pain, angina, palpitations, dyspnea on exertion, peripheral edema.  Respiratory: Negative for dyspnea at rest, dyspnea on exertion, cough, sputum, wheezing.  GI: See history of present illness. GU:  Negative for dysuria, hematuria, urinary incontinence, urinary frequency, nocturnal urination.  Endo: Negative for unusual weight change.    Physical Examination:   BP 107/68   Pulse 90   Temp (!)  97.5 F (36.4 C) (Oral)   Ht 5\' 4"  (1.626 m)   Wt 115 lb (52.2 kg)   LMP 07/14/2018 (Approximate)   BMI 19.74 kg/m   General: Well-nourished, well-developed in no acute distress.  Eyes: No icterus. Mouth: Oropharyngeal mucosa moist and pink , no lesions erythema or exudate. Lungs: Clear to auscultation bilaterally.  Heart: Regular rate and rhythm, no murmurs rubs or gallops.  Abdomen: Bowel sounds are normal, nontender, nondistended, no hepatosplenomegaly or masses, no abdominal bruits or hernia , no rebound or guarding.   Extremities: No lower extremity edema. No clubbing or deformities. Neuro: Alert and oriented x 4   Skin: Warm and dry, no jaundice.   Psych: Alert and cooperative, normal mood and affect.

## 2018-08-26 ENCOUNTER — Encounter: Payer: Self-pay | Admitting: Gastroenterology

## 2018-08-26 NOTE — Progress Notes (Signed)
  Discussed EGD, path with Dr. Jena Gauss regarding whether or not c/w Barrett's. Per Dr. Jena Gauss "As discussed, intestinal metaplasia really at the GE junction. More proximally no columnar epithelium seen. Although photos are a bit suspicious. Not impressive for diagnosis of Barrett's.  Would treat for reflux, treat clinically, no surveillance program at this time."

## 2019-03-14 ENCOUNTER — Encounter: Payer: Self-pay | Admitting: Nurse Practitioner

## 2019-03-14 ENCOUNTER — Other Ambulatory Visit: Payer: Self-pay

## 2019-03-14 ENCOUNTER — Ambulatory Visit (INDEPENDENT_AMBULATORY_CARE_PROVIDER_SITE_OTHER): Payer: Managed Care, Other (non HMO) | Admitting: Nurse Practitioner

## 2019-03-14 VITALS — BP 102/68 | Temp 98.5°F | Ht 62.5 in | Wt 116.6 lb

## 2019-03-14 DIAGNOSIS — M329 Systemic lupus erythematosus, unspecified: Secondary | ICD-10-CM

## 2019-03-14 DIAGNOSIS — Z124 Encounter for screening for malignant neoplasm of cervix: Secondary | ICD-10-CM

## 2019-03-14 DIAGNOSIS — Z1151 Encounter for screening for human papillomavirus (HPV): Secondary | ICD-10-CM

## 2019-03-14 DIAGNOSIS — N946 Dysmenorrhea, unspecified: Secondary | ICD-10-CM | POA: Diagnosis not present

## 2019-03-14 DIAGNOSIS — Z0000 Encounter for general adult medical examination without abnormal findings: Secondary | ICD-10-CM | POA: Diagnosis not present

## 2019-03-14 MED ORDER — BACLOFEN 10 MG PO TABS: 10.0000 mg | ORAL_TABLET | Freq: Three times a day (TID) | ORAL | 0 refills | Status: DC

## 2019-03-14 MED ORDER — MELOXICAM 7.5 MG PO TABS: 7.5000 mg | ORAL_TABLET | Freq: Every day | ORAL | 0 refills | Status: DC

## 2019-03-14 NOTE — Progress Notes (Signed)
Subjective:    Patient ID: Sierra Williams, female    DOB: [birth year], 43 y.o.   MRN: [id number]  Defers chaperone.  HPI The patient comes in today for a wellness visit.    A review of their health history was completed.  A review of medications was also completed.  Any needed refills; none  Eating habits: health conscious  Falls/  MVA accidents in past few months: none  Regular exercise: lots of walking; walks 2-5 miles per day at work  Specialist pt sees on regular basis: GI - Lewie Loron.  Preventative health issues were discussed.   Additional concerns: would like to get a referral to rheumatologist. Saw one in the past.   BTL for birth control. Same partner for 10 years. Defers STI screening.  Menses slightly irregular, 1-2 heavy bleeding; total 3-4 day cycles. Some pain in lumbar sacral area during menses. PAPs have been normal for about 12 years. GERD completely stable with daily use of Protonix. Regular vision and dental exams.  Had her cholesterol done at work. States it was fine. May be having that done again soon.  Requesting referral to rheumatologist for her lupus. Has not been seen for this in years.  It was noted that she states she was cold and all of her fingers had a bluish tinge. When asked about Raynaud's, she states she has been diagnosed with that as part of her lupus.   Review of Systems  Constitutional: Negative for activity change, appetite change and fever.  Respiratory: Negative for cough, chest tightness, shortness of breath and wheezing.   Cardiovascular: Negative for chest pain.  Gastrointestinal: Negative for abdominal distention, abdominal pain, constipation, diarrhea, nausea and vomiting.  Genitourinary: Negative for difficulty urinating, dysuria, enuresis, frequency, genital sores, menstrual problem, pelvic pain, urgency and vaginal discharge.  Skin: Positive for color change.       Color change to fingers when cold     Office Visit from  01/21/2018 in [facility]  PHQ-9 Total Score  2         Objective:   Physical Exam Constitutional:      General: She is not in acute distress.    Appearance: Normal appearance. She is well-developed.  Neck:     Musculoskeletal: Normal range of motion and neck supple.     Thyroid: No thyromegaly.     Trachea: No tracheal deviation.     Comments: Thyroid nontender. No mass or goiter noted.  Cardiovascular:     Rate and Rhythm: Normal rate and regular rhythm.     Heart sounds: Normal heart sounds. No murmur. No gallop.   Pulmonary:     Effort: Pulmonary effort is normal.     Breath sounds: Normal breath sounds.  Chest:     Breasts:        Right: No inverted nipple, mass, skin change or tenderness.        Left: No inverted nipple, mass, skin change or tenderness.  Abdominal:     General: There is no distension.     Palpations: Abdomen is soft.     Tenderness: There is no abdominal tenderness.  Genitourinary:    Vagina: Normal.     Comments: External GU: no rashes or lesions. Vagina: moderate amount of white mucoid discharge. No CMT. Bimanual exam: no tenderness or obvious masses.  Lymphadenopathy:     Cervical: No cervical adenopathy.     Upper Body:     Right upper body: No supraclavicular,  axillary or pectoral adenopathy.     Left upper body: No supraclavicular, axillary or pectoral adenopathy.  Skin:    General: Skin is warm and dry.     Findings: No rash.     Comments: Generalized faint bluish tinge to all of her fingers.   Neurological:     Mental Status: She is alert and oriented to person, place, and time.  Psychiatric:        Mood and Affect: Mood normal.        Behavior: Behavior normal.        Thought Content: Thought content normal.        Judgment: Judgment normal.           Assessment & Plan:   Problem List Items Addressed This Visit      Genitourinary   Dysmenorrhea     Other   Systemic lupus erythematosus ([place])   Relevant  Orders   Ambulatory referral to Rheumatology    Other Visit Diagnoses    Routine general medical examination at a health care facility    -  Primary   Relevant Orders   Pap IG and HPV (high risk) DNA detection   Screening for cervical cancer       Relevant Orders   Pap IG and HPV (high risk) DNA detection   Screening for HPV (human papillomavirus)       Relevant Orders   Pap IG and HPV (high risk) DNA detection     Meds ordered this encounter  Medications  . baclofen (LIORESAL) 10 MG tablet    Sig: Take 1 tablet (10 mg total) by mouth 3 (three) times daily. Prn spasms/cramps    Dispense:  30 each    Refill:  0    Order Specific Question:   Supervising Provider    Answer:   Sallee Lange A [[id number]]  . meloxicam (MOBIC) 7.5 MG tablet    Sig: Take 1 tablet (7.5 mg total) by mouth daily. Prn pain; take with food; DC if stomach upset    Dispense:  30 tablet    Refill:  0    Order Specific Question:   Supervising Provider    Answer:   Sallee Lange A [[id number]]   Use Meloxicam with caution only during menses. DC if any stomach upset.  Refer to rheumatology for SLE. Wants to hold on mammogram for a few months. Patient to call back when she is ready so we can schedule.   No labs today since she gets them at work and her last labs here on 01/21/18 were within normal limits.  Return in about 1 year (around 03/13/2020) for physical.

## 2019-03-15 ENCOUNTER — Encounter: Payer: Self-pay | Admitting: Nurse Practitioner

## 2019-03-15 DIAGNOSIS — M329 Systemic lupus erythematosus, unspecified: Secondary | ICD-10-CM | POA: Insufficient documentation

## 2019-03-15 DIAGNOSIS — N946 Dysmenorrhea, unspecified: Secondary | ICD-10-CM | POA: Insufficient documentation

## 2019-03-25 ENCOUNTER — Encounter: Payer: Self-pay | Admitting: Family Medicine

## 2019-03-26 LAB — PAP IG AND HPV HIGH-RISK: HPV, high-risk: NEGATIVE

## 2019-04-11 ENCOUNTER — Other Ambulatory Visit: Payer: Self-pay | Admitting: Nurse Practitioner

## 2019-04-11 ENCOUNTER — Encounter: Payer: Self-pay | Admitting: Nurse Practitioner

## 2019-04-11 MED ORDER — MELOXICAM 7.5 MG PO TABS: 7.5000 mg | ORAL_TABLET | Freq: Every day | ORAL | 0 refills | Status: DC

## 2019-05-14 ENCOUNTER — Telehealth: Payer: Self-pay | Admitting: Family Medicine

## 2019-05-14 DIAGNOSIS — H9319 Tinnitus, unspecified ear: Secondary | ICD-10-CM

## 2019-05-14 NOTE — Telephone Encounter (Signed)
Referral ordered in EPIC. 

## 2019-05-14 NOTE — Telephone Encounter (Signed)
Let's do 

## 2019-05-14 NOTE — Telephone Encounter (Signed)
Pt left a voice mail requesting referral to new ENT  [facility] for pt - need to know current coverage & will ask Dr. Richardson Landry if we can refer or NTBS

## 2019-05-14 NOTE — Telephone Encounter (Signed)
Pt needs referral to [facility] - having the ocean sound in her ears again Was referred in the past & had a tube placed however Dr. Deeann [place] office is not in pt's insurance network  Please initiate referral in system so that I may process

## 2019-05-15 ENCOUNTER — Other Ambulatory Visit: Payer: Self-pay | Admitting: Gastroenterology

## 2019-05-28 ENCOUNTER — Encounter: Payer: Self-pay | Admitting: Family Medicine

## 2019-05-29 ENCOUNTER — Ambulatory Visit (INDEPENDENT_AMBULATORY_CARE_PROVIDER_SITE_OTHER): Payer: Managed Care, Other (non HMO) | Admitting: Otolaryngology

## 2019-06-02 ENCOUNTER — Other Ambulatory Visit: Payer: Self-pay

## 2019-06-02 ENCOUNTER — Ambulatory Visit (INDEPENDENT_AMBULATORY_CARE_PROVIDER_SITE_OTHER): Payer: Managed Care, Other (non HMO) | Admitting: Otolaryngology

## 2019-06-11 ENCOUNTER — Encounter: Payer: Self-pay | Admitting: Internal Medicine

## 2019-07-28 ENCOUNTER — Encounter: Payer: Self-pay | Admitting: Family Medicine

## 2019-09-04 NOTE — Progress Notes (Signed)
      Primary Care Physician: Merlyn Albert, MD  Primary Gastroenterologist:  Roetta Sessions, MD   Chief Complaint  Patient presents with  . Follow-up    fu GERD    HPI: Sierra Williams is a 44 y.o. female here for follow-up.  She has a history of GERD.  Last seen in January 2020.  EGD in 2019 with mild Schatzki ring in the mid esophagus status post dilation.  Salmon-colored mucosa noted 3 cm above the GE junction, biopsies not convincing of Barrett's.  She has small hiatal hernia.  From a GI standpoint she is doing well.  As long she takes her omeprazole her heartburn is well controlled.  Currently on 40 mg daily.  Denies dysphagia.  No abdominal pain.  Bowel movements occur daily, she notices during her menstrual cycle her stools are somewhat more loose than other times.  Denies any melena or rectal bleeding.  She does have some increased flatulence.  Started new medication in November, Plaquenil.   Current Outpatient Medications  Medication Sig Dispense Refill  . hydroxychloroquine (PLAQUENIL) 200 MG tablet Take by mouth daily.    Marland Kitchen omeprazole (PRILOSEC) 40 MG capsule Take 1 capsule by mouth once daily 30 capsule 5   No current facility-administered medications for this visit.    Allergies as of 09/05/2019  . (No Known Allergies)    ROS:  General: Negative for anorexia, weight loss, fever, chills, fatigue, weakness. ENT: Negative for hoarseness, difficulty swallowing , nasal congestion. CV: Negative for chest pain, angina, palpitations, dyspnea on exertion, peripheral edema.  Respiratory: Negative for dyspnea at rest, dyspnea on exertion, cough, sputum, wheezing.  GI: See history of present illness. GU:  Negative for dysuria, hematuria, urinary incontinence, urinary frequency, nocturnal urination.  Endo: Negative for unusual weight change.    Physical Examination:   BP 117/72   Pulse 96   Temp 97.7 F (36.5 C) (Temporal)   Ht 5\' 3"  (1.6 m)   Wt 117 lb 6.4 oz  (53.3 kg)   LMP 08/25/2019 (Approximate)   BMI 20.80 kg/m   General: Well-nourished, well-developed in no acute distress.  Eyes: No icterus. Mouth: masked Lungs: Clear to auscultation bilaterally.  Heart: Regular rate and rhythm, no murmurs rubs or gallops.  Abdomen: Bowel sounds are normal, nontender, nondistended, no hepatosplenomegaly or masses, no abdominal bruits or hernia , no rebound or guarding.   Extremities: No lower extremity edema. No clubbing or deformities. Neuro: Alert and oriented x 4   Skin: Warm and dry, no jaundice.   Psych: Alert and cooperative, normal mood and affect.    Imaging Studies: No results found.

## 2019-09-05 ENCOUNTER — Other Ambulatory Visit: Payer: Self-pay

## 2019-09-05 ENCOUNTER — Encounter: Payer: Self-pay | Admitting: Gastroenterology

## 2019-09-05 ENCOUNTER — Ambulatory Visit (INDEPENDENT_AMBULATORY_CARE_PROVIDER_SITE_OTHER): Payer: Managed Care, Other (non HMO) | Admitting: Gastroenterology

## 2019-09-05 VITALS — BP 117/72 | HR 96 | Temp 97.7°F | Ht 63.0 in | Wt 117.4 lb

## 2019-09-05 DIAGNOSIS — K219 Gastro-esophageal reflux disease without esophagitis: Secondary | ICD-10-CM

## 2019-09-05 DIAGNOSIS — R143 Flatulence: Secondary | ICD-10-CM

## 2019-09-05 MED ORDER — OMEPRAZOLE 20 MG PO CPDR: 20.0000 mg | DELAYED_RELEASE_CAPSULE | Freq: Every day | ORAL | 11 refills | Status: DC

## 2019-09-05 NOTE — Assessment & Plan Note (Signed)
Doing very well.  Discussed reducing omeprazole to 20 mg daily with the patient today.  She is interested in trying a lower dose.  New prescription provided.  We will have her come back in 2 years or call sooner if needed.

## 2019-09-05 NOTE — Assessment & Plan Note (Signed)
Add probiotic.  Reduce consumption of high FODMAP foods.  Handout provided.  Call with any further problems.

## 2019-09-05 NOTE — Patient Instructions (Signed)
1. Reduce omeprazole to 20mg  daily before breakfast.  New prescription sent to pharmacy.  When you go for refill make sure that you get the 20 mg dose instead of the 40 mg dose. 2. Try probiotic for the next 4 to 8 weeks.  Hardin Negus colon health or Align are great choices. 3. If you are consuming any of the high FODMAP foods (see "Foods to avoid" below) on a regular basis, consider eliminating to see if this helps with your gas. 4. Return to the office in 2 years or sooner if needed.   Low-FODMAP Eating Plan  FODMAPs (fermentable oligosaccharides, disaccharides, monosaccharides, and polyols) are sugars that are hard for some people to digest. A low-FODMAP eating plan may help some people who have bowel (intestinal) diseases to manage their symptoms. This meal plan can be complicated to follow. Work with a diet and nutrition specialist (dietitian) to make a low-FODMAP eating plan that is right for you. A dietitian can make sure that you get enough nutrition from this diet. What are tips for following this plan? Reading food labels  Check labels for hidden FODMAPs such as: ? High-fructose syrup. ? Honey. ? Agave. ? Natural fruit flavors. ? Onion or garlic powder.  Choose low-FODMAP foods that contain 3-4 grams of fiber per serving.  Check food labels for serving sizes. Eat only one serving at a time to make sure FODMAP levels stay low. Meal planning  Follow a low-FODMAP eating plan for up to 6 weeks, or as told by your health care provider or dietitian.  To follow the eating plan: 1. Eliminate high-FODMAP foods from your diet completely. 2. Gradually reintroduce high-FODMAP foods into your diet one at a time. Most people should wait a few days after introducing one high-FODMAP food before they introduce the next high-FODMAP food. Your dietitian can recommend how quickly you may reintroduce foods. 3. Keep a daily record of what you eat and drink, and make note of any symptoms that you have  after eating. 4. Review your daily record with a dietitian regularly. Your dietitian can help you identify which foods you can eat and which foods you should avoid. General tips  Drink enough fluid each day to keep your urine pale yellow.  Avoid processed foods. These often have added sugar and may be high in FODMAPs.  Avoid most dairy products, whole grains, and sweeteners.  Work with a dietitian to make sure you get enough fiber in your diet. Recommended foods Grains  Gluten-free grains, such as rice, oats, buckwheat, quinoa, corn, polenta, and millet. Gluten-free pasta, bread, or cereal. Rice noodles. Corn tortillas. Vegetables  Eggplant, zucchini, cucumber, peppers, green beans, Brussels sprouts, bean sprouts, lettuce, arugula, kale, Swiss chard, spinach, collard greens, bok choy, summer squash, potato, and tomato. Limited amounts of corn, carrot, and sweet potato. Green parts of scallions. Fruits  Bananas, oranges, lemons, limes, blueberries, raspberries, strawberries, grapes, cantaloupe, honeydew melon, kiwi, papaya, passion fruit, and pineapple. Limited amounts of dried cranberries, banana chips, and shredded coconut. Dairy  Lactose-free milk, yogurt, and kefir. Lactose-free cottage cheese and ice cream. Non-dairy milks, such as almond, coconut, hemp, and rice milk. Yogurts made of non-dairy milks. Limited amounts of goat cheese, brie, mozzarella, parmesan, swiss, and other hard cheeses. Meats and other protein foods  Unseasoned beef, pork, poultry, or fish. Eggs. Berniece Salines. Tofu (firm) and tempeh. Limited amounts of nuts and seeds, such as almonds, walnuts, [place] nuts, pecans, peanuts, pumpkin seeds, chia seeds, and sunflower seeds. Fats and oils  Butter-free spreads. Vegetable oils, such as olive, canola, and sunflower oil. Seasoning and other foods  Artificial sweeteners with names that do not end in "ol" such as aspartame, saccharine, and stevia. Maple syrup, white table  sugar, raw sugar, brown sugar, and molasses. Fresh basil, coriander, parsley, rosemary, and thyme. Beverages  Water and mineral water. Sugar-sweetened soft drinks. Small amounts of orange juice or cranberry juice. Black and green tea. Most dry wines. Coffee. This may not be a complete list of low-FODMAP foods. Talk with your dietitian for more information.   Foods to avoid Grains  Wheat, including kamut, durum, and semolina. Barley and bulgur. Couscous. Wheat-based cereals. Wheat noodles, bread, crackers, and pastries. Vegetables  Chicory root, artichoke, asparagus, cabbage, snow peas, sugar snap peas, mushrooms, and cauliflower. Onions, garlic, leeks, and the white part of scallions. Fruits  Fresh, dried, and juiced forms of apple, pear, watermelon, peach, plum, cherries, apricots, blackberries, boysenberries, figs, nectarines, and mango. Avocado. Dairy  Milk, yogurt, ice cream, and soft cheese. Cream and sour cream. Milk-based sauces. Custard. Meats and other protein foods  Fried or fatty meat. Sausage. Cashews and pistachios. Soybeans, baked beans, black beans, chickpeas, kidney beans, fava beans, navy beans, lentils, and split peas. Seasoning and other foods  Any sugar-free gum or candy. Foods that contain artificial sweeteners such as sorbitol, mannitol, isomalt, or xylitol. Foods that contain honey, high-fructose corn syrup, or agave. Bouillon, vegetable stock, beef stock, and chicken stock. Garlic and onion powder. Condiments made with onion, such as hummus, chutney, pickles, relish, salad dressing, and salsa. Tomato paste. Beverages  Chicory-based drinks. Coffee substitutes. Chamomile tea. Fennel tea. Sweet or fortified wines such as port or sherry. Diet soft drinks made with isomalt, mannitol, maltitol, sorbitol, or xylitol. Apple, pear, and mango juice. Juices with high-fructose corn syrup. This may not be a complete list of high-FODMAP foods. Talk with your dietitian to  discuss what dietary choices are best for you.  Summary  A low-FODMAP eating plan is a short-term diet that eliminates FODMAPs from your diet to help ease symptoms of certain bowel diseases.  The eating plan usually lasts up to 6 weeks. After that, high-FODMAP foods are restarted gradually, one at a time, so you can find out which may be causing symptoms.  A low-FODMAP eating plan can be complicated. It is best to work with a dietitian who has experience with this type of plan. This information is not intended to replace advice given to you by your health care provider. Make sure you discuss any questions you have with your health care provider. Document Revised: 05/25/2017 Document Reviewed: 02/06/2017 Elsevier Patient Education  2020 [employer].

## 2019-11-14 ENCOUNTER — Encounter: Payer: Self-pay | Admitting: Family Medicine

## 2020-01-28 ENCOUNTER — Telehealth: Payer: Self-pay | Admitting: Family Medicine

## 2020-01-28 DIAGNOSIS — Z1321 Encounter for screening for nutritional disorder: Secondary | ICD-10-CM

## 2020-01-28 DIAGNOSIS — Z13220 Encounter for screening for lipoid disorders: Secondary | ICD-10-CM

## 2020-01-28 DIAGNOSIS — Z1329 Encounter for screening for other suspected endocrine disorder: Secondary | ICD-10-CM

## 2020-01-28 DIAGNOSIS — Z0000 Encounter for general adult medical examination without abnormal findings: Secondary | ICD-10-CM

## 2020-01-28 NOTE — Telephone Encounter (Signed)
Patient has physical on September 20 and needing labs done

## 2020-01-28 NOTE — Telephone Encounter (Signed)
Last labs 12/2017: CMET, TSH, CBC, Vit D

## 2020-01-30 NOTE — Telephone Encounter (Signed)
Yes pls order those and add lipid panel. Thx. Dr. Karie Schwalbe

## 2020-01-30 NOTE — Telephone Encounter (Signed)
Lab orders placed and pt is aware 

## 2020-03-15 ENCOUNTER — Encounter: Payer: Self-pay | Admitting: Family Medicine

## 2020-03-15 ENCOUNTER — Other Ambulatory Visit: Payer: Self-pay

## 2020-03-15 ENCOUNTER — Ambulatory Visit (INDEPENDENT_AMBULATORY_CARE_PROVIDER_SITE_OTHER): Payer: Managed Care, Other (non HMO) | Admitting: Family Medicine

## 2020-03-15 VITALS — BP 128/76 | HR 84 | Temp 96.0°F | Ht 62.5 in | Wt 118.2 lb

## 2020-03-15 DIAGNOSIS — E041 Nontoxic single thyroid nodule: Secondary | ICD-10-CM

## 2020-03-15 DIAGNOSIS — Z1231 Encounter for screening mammogram for malignant neoplasm of breast: Secondary | ICD-10-CM | POA: Diagnosis not present

## 2020-03-15 DIAGNOSIS — M329 Systemic lupus erythematosus, unspecified: Secondary | ICD-10-CM

## 2020-03-15 DIAGNOSIS — Z23 Encounter for immunization: Secondary | ICD-10-CM

## 2020-03-15 DIAGNOSIS — Z0000 Encounter for general adult medical examination without abnormal findings: Secondary | ICD-10-CM

## 2020-03-15 NOTE — Progress Notes (Signed)
Patient ID: Sierra Williams, female    DOB: 01-27-1976, 44 y.o.   MRN: [id number]   Chief Complaint  Patient presents with  . Annual Exam   Subjective:    HPI   The patient comes in today for a wellness visit.  A review of their health history was completed.  A review of medications was also completed.  Any needed refills; none  Eating habits: healthy eating  Falls/  MVA accidents in past few months: none  Regular exercise: lots of walking  Specialist pt sees on regular basis: rheumatologist; GI  Preventative health issues were discussed.   Additional concerns: none Pt taking plaquenil for SLE.  Not on folic acid.  Reviewed labs, no new concern.s  Seeing GI yearly.  Has h/o gerd- had EGD and stretched the esophagus.  Taking omeprazole.  H/o thyroid nodule- 2019, had u/s and was normal.  2 small thyroid nodules on right, too small for follow up or biopsy. Mother has h/o hypothyroid.  Not gotten a mammo yearly.  Dr. Dierdre Forth - rheum. Dr. Melvyn Neth -gastro Gaylyn Cheers NP for well woman exam. Last one in 2020.  Medical History Sierra Williams has a past medical history of Collapsed lung, Lupus (HCC) (2004), Otitis media, serous, TM rupture, and Reflux.   Outpatient Encounter Medications as of 03/15/2020  Medication Sig  . hydroxychloroquine (PLAQUENIL) 200 MG tablet Take by mouth daily.  Marland Kitchen omeprazole (PRILOSEC) 20 MG capsule Take 1 capsule (20 mg total) by mouth daily before breakfast.   No facility-administered encounter medications on file as of 03/15/2020.    Review of Systems  Constitutional: Negative for chills and fever.  HENT: Negative for congestion, rhinorrhea and sore throat.   Respiratory: Negative for cough, shortness of breath and wheezing.   Cardiovascular: Negative for chest pain and leg swelling.  Gastrointestinal: Negative for abdominal pain, diarrhea, nausea and vomiting.  Genitourinary: Negative for dysuria and frequency.  Musculoskeletal:  Negative for arthralgias and back pain.  Skin: Negative for rash.  Neurological: Negative for dizziness, weakness and headaches.    Vitals BP 128/76   Pulse 84   Temp (!) 96 F (35.6 C)   Ht 5' 2.5" (1.588 m)   Wt 118 lb 3.2 oz (53.6 kg)   SpO2 100%   BMI 21.27 kg/m   Objective:   Physical Exam Vitals and nursing note reviewed.  Constitutional:      Appearance: Normal appearance.  HENT:     Head: Normocephalic and atraumatic.     Nose: Nose normal.     Mouth/Throat:     Mouth: Mucous membranes are moist.     Pharynx: Oropharynx is clear.  Eyes:     Extraocular Movements: Extraocular movements intact.     Conjunctiva/sclera: Conjunctivae normal.     Pupils: Pupils are equal, round, and reactive to light.  Cardiovascular:     Rate and Rhythm: Normal rate and regular rhythm.     Pulses: Normal pulses.     Heart sounds: Normal heart sounds.  Pulmonary:     Effort: Pulmonary effort is normal.     Breath sounds: Normal breath sounds. No wheezing, rhonchi or rales.  Musculoskeletal:        General: Normal range of motion.     Right lower leg: No edema.     Left lower leg: No edema.  Skin:    General: Skin is warm and dry.     Findings: No lesion or rash.  Neurological:  General: No focal deficit present.     Mental Status: She is alert and oriented to person, place, and time.  Psychiatric:        Mood and Affect: Mood normal.        Behavior: Behavior normal.      Assessment and Plan   1. Adult general medical exam  2. Need for vaccination - Flu Vaccine QUAD 6+ mos PF IM (Fluarix Quad PF)  3. Screening mammogram, encounter for - MM DIGITAL SCREENING BILATERAL  4. Thyroid nodule  5. Systemic lupus erythematosus, unspecified SLE type, unspecified organ involvement status (HCC)   SLE- stable, cont to f/u with rheum.  Thyroid nodules- stable, work up done in 2019, with u/s and not needing follow up scan for them.  HM- ordered mammo and flu  vaccine.  F/u 1 yr or prn.

## 2020-04-02 ENCOUNTER — Ambulatory Visit
Admission: RE | Admit: 2020-04-02 | Discharge: 2020-04-02 | Disposition: A | Discharge status: Home / Self Care | Payer: Managed Care, Other (non HMO) | Source: Ambulatory Visit | Attending: Family Medicine | Admitting: Family Medicine

## 2020-04-02 ENCOUNTER — Other Ambulatory Visit: Payer: Self-pay

## 2020-04-09 ENCOUNTER — Other Ambulatory Visit: Payer: Self-pay | Admitting: *Deleted

## 2020-04-09 ENCOUNTER — Other Ambulatory Visit: Payer: Self-pay | Admitting: Family Medicine

## 2020-04-09 DIAGNOSIS — R928 Other abnormal and inconclusive findings on diagnostic imaging of breast: Secondary | ICD-10-CM

## 2020-04-23 ENCOUNTER — Ambulatory Visit
Admission: RE | Admit: 2020-04-23 | Discharge: 2020-04-23 | Disposition: A | Discharge status: Home / Self Care | Payer: Managed Care, Other (non HMO) | Source: Ambulatory Visit | Attending: Family Medicine | Admitting: Family Medicine

## 2020-04-23 ENCOUNTER — Other Ambulatory Visit: Payer: Self-pay | Admitting: Family Medicine

## 2020-04-23 ENCOUNTER — Other Ambulatory Visit: Payer: Self-pay

## 2020-04-23 DIAGNOSIS — R928 Other abnormal and inconclusive findings on diagnostic imaging of breast: Secondary | ICD-10-CM

## 2020-07-15 ENCOUNTER — Other Ambulatory Visit (HOSPITAL_COMMUNITY)
Admission: RE | Admit: 2020-07-15 | Discharge: 2020-07-15 | Disposition: A | Discharge status: Home / Self Care | Payer: Managed Care, Other (non HMO) | Source: Ambulatory Visit | Attending: Family Medicine | Admitting: Family Medicine

## 2020-07-15 ENCOUNTER — Other Ambulatory Visit: Payer: Self-pay

## 2020-07-15 ENCOUNTER — Other Ambulatory Visit: Payer: Self-pay | Admitting: Family Medicine

## 2020-07-15 ENCOUNTER — Other Ambulatory Visit: Payer: Self-pay | Admitting: *Deleted

## 2020-07-15 ENCOUNTER — Ambulatory Visit (HOSPITAL_COMMUNITY)
Admission: RE | Admit: 2020-07-15 | Discharge: 2020-07-15 | Disposition: A | Discharge status: Home / Self Care | Payer: Managed Care, Other (non HMO) | Source: Ambulatory Visit | Attending: Family Medicine | Admitting: Family Medicine

## 2020-07-15 ENCOUNTER — Encounter: Payer: Self-pay | Admitting: Family Medicine

## 2020-07-15 ENCOUNTER — Ambulatory Visit (INDEPENDENT_AMBULATORY_CARE_PROVIDER_SITE_OTHER): Payer: Managed Care, Other (non HMO) | Admitting: Family Medicine

## 2020-07-15 VITALS — BP 122/84 | HR 84 | Temp 97.5°F | Ht 62.5 in | Wt 123.4 lb

## 2020-07-15 DIAGNOSIS — M79662 Pain in left lower leg: Secondary | ICD-10-CM | POA: Insufficient documentation

## 2020-07-15 DIAGNOSIS — M79605 Pain in left leg: Secondary | ICD-10-CM | POA: Insufficient documentation

## 2020-07-15 DIAGNOSIS — M545 Low back pain, unspecified: Secondary | ICD-10-CM | POA: Diagnosis not present

## 2020-07-15 LAB — D-DIMER, QUANTITATIVE: D-Dimer, Quant: 1.1 ug/mL-FEU — ABNORMAL HIGH (ref 0.00–0.50)

## 2020-07-15 MED ORDER — CYCLOBENZAPRINE HCL 5 MG PO TABS: 5.0000 mg | ORAL_TABLET | Freq: Three times a day (TID) | ORAL | 0 refills | Status: DC | PRN

## 2020-07-15 MED ORDER — NAPROXEN 500 MG PO TABS
500.0000 mg | ORAL_TABLET | Freq: Two times a day (BID) | ORAL | 0 refills | Status: DC
Start: 2020-07-15 — End: 2020-07-29

## 2020-07-15 NOTE — Progress Notes (Signed)
Pt has been having left leg pain and lower back pain since Thanksgiving. Pain in back part of knee and top of calf. No swelling but does feel tight at time when standing. Low back pain on left side (sciatica?) Alternating Tylenol, Advil and Motrin. Pt has history of RA and Lupus.     Patient ID: Sierra Williams, female    DOB: 11-Jan-1976, 45 y.o.   MRN: [id number]   Chief Complaint  Patient presents with  . Leg Pain   Subjective:  CC: left pain and low back pain  This is a new problem.  Presents today for an acute visit with a complaint of pain in the back of the left knee.  Reports it as "catching "also reports pain in the upper left calf area.  Denies injury, swelling, redness, numbness tingling or radiating pain.  Symptoms started on Thanksgiving, pain is worse with looking down and with some movement.  Then approximately 2 weeks ago started to have pain in the lower back, center of low back.  Pain is relieved with pushing on the center of the back.  Denies numbness, tingling, radiating pain, saddle paresthesia.  Or weakness.  Has tried Tylenol and Motrin occasionally.  Has a history of rheumatoid arthritis and lupus.  Denies fever, chills, chest pain, shortness of breath.    Medical History Sierra Williams has a past medical history of Collapsed lung, Lupus (HCC) (2004), Otitis media, serous, TM rupture, and Reflux.   Outpatient Encounter Medications as of 07/15/2020  Medication Sig  . cyclobenzaprine (FLEXERIL) 5 MG tablet Take 1 tablet (5 mg total) by mouth 3 (three) times daily as needed for muscle spasms.  . hydroxychloroquine (PLAQUENIL) 200 MG tablet Take by mouth daily.  . naproxen (NAPROSYN) 500 MG tablet Take 1 tablet (500 mg total) by mouth 2 (two) times daily with a meal.  . omeprazole (PRILOSEC) 20 MG capsule Take 1 capsule (20 mg total) by mouth daily before breakfast.   No facility-administered encounter medications on file as of 07/15/2020.     Review of Systems   Constitutional: Positive for fatigue. Negative for chills and fever.  HENT: Negative for congestion and ear pain.   Respiratory: Negative for cough, chest tightness and shortness of breath.   Cardiovascular: Negative for chest pain and leg swelling.  Gastrointestinal: Negative for abdominal pain, blood in stool, diarrhea, nausea and vomiting.  Genitourinary: Negative for dysuria and hematuria.  Musculoskeletal: Positive for back pain.       Feels back stiffness after prolonged sitting- then it straightens up.  Neurological: Positive for weakness and numbness. Negative for dizziness, light-headedness and headaches.  Hematological: Negative for adenopathy.     Vitals BP 122/84   Pulse 84   Temp (!) 97.5 F (36.4 C)   Ht 5' 2.5" (1.588 m)   Wt 123 lb 6.4 oz (56 kg)   SpO2 99%   BMI 22.21 kg/m   Objective:   Physical Exam Vitals reviewed.  Constitutional:      General: She is not in acute distress.    Appearance: Normal appearance.  Eyes:     Extraocular Movements: Extraocular movements intact.     Pupils: Pupils are equal, round, and reactive to light.  Cardiovascular:     Rate and Rhythm: Normal rate and regular rhythm.     Heart sounds: Normal heart sounds.  Pulmonary:     Effort: Pulmonary effort is normal.     Breath sounds: Normal breath sounds.  Musculoskeletal:  General: No swelling, tenderness or signs of injury. Normal range of motion.     Comments: 5/5 strength upper and lower extremities  Skin:    General: Skin is warm and dry.  Neurological:     General: No focal deficit present.     Mental Status: She is alert.     Motor: No weakness.     Coordination: Coordination normal. Finger-Nose-Finger Test and Heel to Garza-Salinas II Test normal.     Gait: Gait is intact.  Psychiatric:        Behavior: Behavior normal.      Assessment and Plan   1. Acute pain of left lower extremity - D-Dimer, Quantitative - US Venous Img Lower Unilateral Left  2. Pain of  left calf - D-Dimer, Quantitative - US Venous Img Lower Unilateral Left  3. Acute midline low back pain without sciatica - naproxen (NAPROSYN) 500 MG tablet; Take 1 tablet (500 mg total) by mouth 2 (two) times daily with a meal.  Dispense: 30 tablet; Refill: 0 - cyclobenzaprine (FLEXERIL) 5 MG tablet; Take 1 tablet (5 mg total) by mouth 3 (three) times daily as needed for muscle spasms.  Dispense: 30 tablet; Refill: 0   Due to the nature of the left knee and calf pain will send to [facility] for stat D-dimer and ultrasound to rule out deep vein thrombosis.  If this is negative, will treat conservatively with anti-inflammatory, and muscle relaxers for low back pain and back of knee pain.  Update: D-dimer 1.10, ultrasound negative for DVT.  Will repeat ultrasound in 6 days due to D-dimer being elevated.  This is scheduled for July 21, 2020.  Patient notified via nurse, naproxen and muscle relaxer sent to the pharmacy to treat low back pain.  Agrees with plan of care discussed today. Understands warning signs to seek further care: chest pain, shortness of breath, any significant change in health.  Understands to follow-up in 2 weeks for low back pain, will treat with NSAIDs, muscle relaxants, heat.  We will repeat ultrasound of left lower extremity on July 21, 2020 to ensure no DVT, due to elevated D-dimer.   Novella Olive, NP 07/15/2020

## 2020-07-15 NOTE — Patient Instructions (Signed)

## 2020-07-19 NOTE — Telephone Encounter (Signed)
Pt seen 1/20 and had positive d dimer and negative u/s. See pt's message below. She is wanting to reschedule u/s due to death in family and will be traveling out of state.

## 2020-07-19 NOTE — Telephone Encounter (Signed)
I called and discussed with pt. Pt states she understands the risk but wanted to reschedule the u/s and appt anyways. I did reiterate that there is a risk that she could have a blood clot in leg that could travel to her lungs and cause serious problems for her. She still wanted to reschedule. I told her I could not reschedule because that would be me saying it is ok to wait when it is advised not to wait. She states she will call when she gets back if she decides not to do scan for [place] to schedule at that time. Message sent to karen since she ordered and to dr taylor since karen is out of the office rest of the day and tomorrow.

## 2020-07-21 ENCOUNTER — Ambulatory Visit (HOSPITAL_COMMUNITY)
Admission: RE | Admit: 2020-07-21 | Discharge: 2020-07-21 | Disposition: A | Discharge status: Home / Self Care | Payer: Managed Care, Other (non HMO) | Source: Ambulatory Visit | Attending: Family Medicine | Admitting: Family Medicine

## 2020-07-21 ENCOUNTER — Other Ambulatory Visit: Payer: Self-pay

## 2020-07-21 DIAGNOSIS — M79662 Pain in left lower leg: Secondary | ICD-10-CM | POA: Insufficient documentation

## 2020-07-21 DIAGNOSIS — M79605 Pain in left leg: Secondary | ICD-10-CM | POA: Diagnosis present

## 2020-07-21 NOTE — Telephone Encounter (Signed)
Ultrasound is today. Await results then will discuss changing appt with pt

## 2020-07-21 NOTE — Telephone Encounter (Signed)
Please see message below and change appt to virtual

## 2020-07-29 ENCOUNTER — Encounter: Payer: Self-pay | Admitting: Family Medicine

## 2020-07-29 ENCOUNTER — Other Ambulatory Visit: Payer: Self-pay

## 2020-07-29 ENCOUNTER — Telehealth (INDEPENDENT_AMBULATORY_CARE_PROVIDER_SITE_OTHER): Payer: Managed Care, Other (non HMO) | Admitting: Family Medicine

## 2020-07-29 DIAGNOSIS — M545 Low back pain, unspecified: Secondary | ICD-10-CM | POA: Diagnosis not present

## 2020-07-29 MED ORDER — MELOXICAM 15 MG PO TABS: 15.0000 mg | ORAL_TABLET | Freq: Every day | ORAL | 0 refills | Status: DC

## 2020-07-29 NOTE — Progress Notes (Signed)
Patient ID: Sierra Williams, female    DOB: [birth year], 45 y.o.   MRN: [id number]   Chief Complaint  Patient presents with  . Back Pain   Subjective:  CC: follow-up for back and knee pain  This is not a new problem.  Presents today via telephone visit to follow-up on back pain and knee pain.  Initially seen on January 20, with back pain, knee pain, and described pain in the back of her knee area there was concern for DVT at that time, she was sent over for D-dimer and ultrasound.  D-dimer was slightly elevated, ultrasound negative.  She had a repeat ultrasound on January 26 which was also negative for deep vein thrombosis.  She reports that the knee pain is somewhat better, worse with bending and with increased activity.  She reports that she walked more yesterday at work and this seems to hurt her knee more.  She has been taking the naproxen, which has been helping some, Flexeril has not been helpful so she has not been taking the Flexeril.   follow up on back and knee pain.   Left knee pain is better but still having some pain but not as often.   Still having low back pain. It depends on how much she does during the day on how bad the pain is. Taking naproxen. Only taken flexeril a few times.   Virtual Visit via Telephone Note  I connected with Sierra Williams on 07/29/20 at  2:00 PM EST by telephone and verified that I am speaking with the correct person using two identifiers.  Location: Patient: home Provider: office   I discussed the limitations, risks, security and privacy concerns of performing an evaluation and management service by telephone and the availability of in person appointments. I also discussed with the patient that there may be a patient responsible charge related to this service. The patient expressed understanding and agreed to proceed.   History of Present Illness:    Observations/Objective:   Assessment and Plan:   Follow Up Instructions:    I  discussed the assessment and treatment plan with the patient. The patient was provided an opportunity to ask questions and all were answered. The patient agreed with the plan and demonstrated an understanding of the instructions.   The patient was advised to call back or seek an in-person evaluation if the symptoms worsen or if the condition fails to improve as anticipated.  I provided 11 minutes of non-face-to-face time during this encounter.       Medical History Sierra Williams has a past medical history of Collapsed lung, Lupus (HCC) (2004), Otitis media, serous, TM rupture, and Reflux.   Outpatient Encounter Medications as of 07/29/2020  Medication Sig  . cyclobenzaprine (FLEXERIL) 5 MG tablet Take 1 tablet (5 mg total) by mouth 3 (three) times daily as needed for muscle spasms.  . hydroxychloroquine (PLAQUENIL) 200 MG tablet Take by mouth daily.  . meloxicam (MOBIC) 15 MG tablet Take 1 tablet (15 mg total) by mouth daily.  Marland Kitchen omeprazole (PRILOSEC) 20 MG capsule Take 1 capsule (20 mg total) by mouth daily before breakfast.  . [DISCONTINUED] naproxen (NAPROSYN) 500 MG tablet Take 1 tablet (500 mg total) by mouth 2 (two) times daily with a meal.   No facility-administered encounter medications on file as of 07/29/2020.     Review of Systems  Constitutional: Negative for chills and fever.  HENT: Negative for congestion.   Respiratory: Negative for cough and shortness  of breath.   Cardiovascular: Negative for chest pain.  Musculoskeletal: Positive for back pain.       Knee pain     Vitals There were no vitals taken for this visit. unable Objective:   Physical Exam No physical exam today, able to converse throughout telephone visit without obvious shortness of breath.  Assessment and Plan   1. Acute midline low back pain without sciatica - meloxicam (MOBIC) 15 MG tablet; Take 1 tablet (15 mg total) by mouth daily.  Dispense: 30 tablet; Refill: 0   Discussed options for continued  back pain.  Offered physical therapy referral, she declines at this time.  Offered orthopedic referral, she declines at this time.  Due to her autoimmune issues, she has a rheumatology appointment on March 4, she wishes to touch base with her rheumatologist to see if the symptoms could possibly be  related to her current condition.  She has been taking naproxen, Flexeril not helpful, ordered meloxicam daily to assist with the pain she will stop taking the naproxen.  Denies cardiac or GI problems.  Agrees with plan of care discussed today. Understands warning signs to seek further care: chest pain, shortness of breath, any significant change in health.  Understands to follow-up if symptoms do not improve, or worsen.  She will let us know next steps after speaking with rheumatologist.    Novella Olive, NP 07/29/2020

## 2020-09-23 ENCOUNTER — Other Ambulatory Visit: Payer: Self-pay | Admitting: Gastroenterology

## 2020-10-25 ENCOUNTER — Ambulatory Visit
Admission: RE | Admit: 2020-10-25 | Discharge: 2020-10-25 | Disposition: A | Discharge status: Home / Self Care | Payer: Managed Care, Other (non HMO) | Source: Ambulatory Visit | Attending: Family Medicine | Admitting: Family Medicine

## 2020-10-25 ENCOUNTER — Other Ambulatory Visit: Payer: Self-pay | Admitting: Family Medicine

## 2020-10-25 ENCOUNTER — Other Ambulatory Visit: Payer: Self-pay

## 2020-10-25 DIAGNOSIS — R928 Other abnormal and inconclusive findings on diagnostic imaging of breast: Secondary | ICD-10-CM

## 2020-10-25 DIAGNOSIS — R921 Mammographic calcification found on diagnostic imaging of breast: Secondary | ICD-10-CM

## 2021-03-16 ENCOUNTER — Other Ambulatory Visit: Payer: Self-pay

## 2021-03-16 ENCOUNTER — Ambulatory Visit (INDEPENDENT_AMBULATORY_CARE_PROVIDER_SITE_OTHER): Payer: Managed Care, Other (non HMO) | Admitting: Family Medicine

## 2021-03-16 ENCOUNTER — Encounter: Payer: Self-pay | Admitting: Family Medicine

## 2021-03-16 VITALS — BP 108/69 | HR 83 | Temp 97.5°F | Ht 63.0 in | Wt 119.8 lb

## 2021-03-16 DIAGNOSIS — Z114 Encounter for screening for human immunodeficiency virus [HIV]: Secondary | ICD-10-CM

## 2021-03-16 DIAGNOSIS — G8929 Other chronic pain: Secondary | ICD-10-CM | POA: Insufficient documentation

## 2021-03-16 DIAGNOSIS — Z0001 Encounter for general adult medical examination with abnormal findings: Secondary | ICD-10-CM | POA: Diagnosis not present

## 2021-03-16 DIAGNOSIS — Z0000 Encounter for general adult medical examination without abnormal findings: Secondary | ICD-10-CM

## 2021-03-16 DIAGNOSIS — Z1159 Encounter for screening for other viral diseases: Secondary | ICD-10-CM

## 2021-03-16 DIAGNOSIS — Z23 Encounter for immunization: Secondary | ICD-10-CM | POA: Diagnosis not present

## 2021-03-16 DIAGNOSIS — N926 Irregular menstruation, unspecified: Secondary | ICD-10-CM

## 2021-03-16 DIAGNOSIS — M519 Unspecified thoracic, thoracolumbar and lumbosacral intervertebral disc disorder: Secondary | ICD-10-CM | POA: Insufficient documentation

## 2021-03-16 DIAGNOSIS — Z1212 Encounter for screening for malignant neoplasm of rectum: Secondary | ICD-10-CM

## 2021-03-16 DIAGNOSIS — Z1211 Encounter for screening for malignant neoplasm of colon: Secondary | ICD-10-CM | POA: Diagnosis not present

## 2021-03-16 DIAGNOSIS — M069 Rheumatoid arthritis, unspecified: Secondary | ICD-10-CM | POA: Insufficient documentation

## 2021-03-16 NOTE — Progress Notes (Signed)
Subjective:  Patient ID: Sierra Williams, female    DOB: Dec 05, 1975, 45 y.o.   MRN: [id number]  Patient Care Team: Baruch Gouty, FNP as PCP - General (Family Medicine)   Chief Complaint:  New Patient (Initial Visit) (Establish care, no specific need)   HPI: Sierra Williams is a 45 y.o. female presenting on 03/16/2021 for New Patient (Initial Visit) (Establish care, no specific need)   Pt is a very pleasant 45 year old who presents today to establish care with new PCP and for her annual physical exam. She has SLE and RA, on plaquenil with control of symptoms. She is followed by rheumatology on a regular basis. She has well controlled GERD, currently on PPI therapy. PAP was last year, normal. Mammogram is scheduled for 04/2021. She has never had colorectal cancer screening. No first degree relatives with history of CRC. She does try to follow a healthy diet and stay active. Her only complaint today is irregular menses. States this has been ongoing for several years. She had her normal cycle 2 weeks early, lasted for 5 days and now she is spotting again. She had a BTL several years ago and denies chance of pregnancy. No hot flashes, mood swings, hair or skin changes, sleep disturbances, weight changes, or abdominal pain with irregular menses.   Relevant past medical, surgical, family, and social history reviewed and updated as indicated.  Allergies and medications reviewed and updated. Data reviewed: Chart in Epic.   Past Medical History:  Diagnosis Date  . Arthritis   . Collapsed lung   . Lupus ([facility]) 2004  . Otitis media, serous, TM rupture   . Reflux     Past Surgical History:  Procedure Laterality Date  . BIOPSY  04/10/2018   Procedure: BIOPSY;  Surgeon: Daneil Dolin, MD;  Location: [facility];  Service: Endoscopy;;  esophagus _0 ,33  . CHEST TUBE INSERTION    . ESOPHAGOGASTRODUODENOSCOPY N/A 04/10/2018   Dr. Gala Romney: Mild Schatzki ring in the mid esophagus status post  dilation.  Salmon colored mucosa present circumferentially 3 cm above the GE junction.  Small hiatal hernia.  Biopsies consistent with mild chronic inflammation, reactive changes.  Intestinal metaplasia present, negative for dysplasia.  Gastric mucosa with mild inflammation.  Per Dr. Gala Romney: Bx not impressive for diagnosis of Barrett's  . left VATS for pneumothorax  2011  . LUNG SURGERY Left 2011  . MALONEY DILATION N/A 04/10/2018   Procedure: Venia Minks DILATION;  Surgeon: Daneil Dolin, MD;  Location: [facility];  Service: Endoscopy;  Laterality: N/A;  . TUBAL LIGATION    . WRIST SURGERY Right     Social History   Socioeconomic History  . Marital status: Married    Spouse name: Not on file  . Number of children: Not on file  . Years of education: Not on file  . Highest education level: Not on file  Occupational History  . Not on file  Tobacco Use  . Smoking status: Former  . Smokeless tobacco: Former    Quit date: 03/27/2010  Vaping Use  . Vaping Use: Never used  Substance and Sexual Activity  . Alcohol use: Yes    Comment: occas  . Drug use: Never  . Sexual activity: Yes    Birth control/protection: Surgical  Other Topics Concern  . Not on file  Social History Narrative  . Not on file   Social Determinants of Health   Financial Resource Strain: Not on file  Food  Insecurity: Not on file  Transportation Needs: Not on file  Physical Activity: Not on file  Stress: Not on file  Social Connections: Not on file  Intimate Partner Violence: Not on file    Outpatient Encounter Medications as of 03/16/2021  Medication Sig  . hydroxychloroquine (PLAQUENIL) 200 MG tablet Take by mouth daily.  Marland Kitchen omeprazole (PRILOSEC) 20 MG capsule TAKE 1 CAPSULE BY MOUTH ONCE DAILY BEFORE BREAKFAST  . [DISCONTINUED] cyclobenzaprine (FLEXERIL) 5 MG tablet Take 1 tablet (5 mg total) by mouth 3 (three) times daily as needed for muscle spasms.  . [DISCONTINUED] meloxicam (MOBIC) 15 MG tablet Take  1 tablet (15 mg total) by mouth daily.   No facility-administered encounter medications on file as of 03/16/2021.    No Known Allergies  Review of Systems  Constitutional:  Negative for activity change, appetite change, chills, diaphoresis, fatigue, fever and unexpected weight change.  HENT: Negative.    Eyes: Negative.   Respiratory:  Negative for cough, chest tightness and shortness of breath.   Cardiovascular:  Negative for chest pain, palpitations and leg swelling.  Gastrointestinal:  Negative for abdominal pain, blood in stool, constipation, diarrhea, nausea and vomiting.  Endocrine: Negative.   Genitourinary:  Positive for menstrual problem. Negative for decreased urine volume, difficulty urinating, dyspareunia, dysuria, enuresis, flank pain, frequency, genital sores, hematuria, pelvic pain, urgency, vaginal bleeding, vaginal discharge and vaginal pain.  Musculoskeletal:  Positive for arthralgias and myalgias. Negative for back pain, gait problem, joint swelling, neck pain and neck stiffness.  Skin: Negative.   Allergic/Immunologic: Negative.   Neurological:  Negative for dizziness, tremors, seizures, syncope, facial asymmetry, speech difficulty, weakness, light-headedness, numbness and headaches.  Hematological: Negative.   Psychiatric/Behavioral:  Negative for confusion, hallucinations, sleep disturbance and suicidal ideas.   All other systems reviewed and are negative.      Objective:  BP 108/69   Pulse 83   Temp (!) 97.5 F (36.4 C)   Ht _0  (1.6 m)   Wt 119 lb 12.8 oz (54.3 kg)   LMP 03/09/2021 (Approximate)   SpO2 98%   BMI 21.22 kg/m    Wt Readings from Last 3 Encounters:  03/16/21 119 lb 12.8 oz (54.3 kg)  07/15/20 123 lb 6.4 oz (56 kg)  03/15/20 118 lb 3.2 oz (53.6 kg)    Physical Exam Vitals and nursing note reviewed.  Constitutional:      General: She is not in acute distress.    Appearance: Normal appearance. She is well-developed, well-groomed and  normal weight. She is not ill-appearing, toxic-appearing or diaphoretic.  HENT:     Head: Normocephalic and atraumatic.     Jaw: There is normal jaw occlusion.     Right Ear: Hearing, tympanic membrane, ear canal and external ear normal.     Left Ear: Hearing, tympanic membrane, ear canal and external ear normal.     Nose: Nose normal.     Mouth/Throat:     Lips: Pink.     Mouth: Mucous membranes are moist.     Pharynx: Oropharynx is clear. Uvula midline.  Eyes:     General: Lids are normal.     Extraocular Movements: Extraocular movements intact.     Conjunctiva/sclera: Conjunctivae normal.     Pupils: Pupils are equal, round, and reactive to light.  Neck:     Thyroid: No thyroid mass, thyromegaly or thyroid tenderness.     Vascular: No carotid bruit or JVD.     Trachea: Trachea and phonation normal.  Cardiovascular:     Rate and Rhythm: Normal rate and regular rhythm.     Chest Wall: PMI is not displaced.     Pulses: Normal pulses.     Heart sounds: Normal heart sounds. No murmur heard.   No friction rub. No gallop.  Pulmonary:     Effort: Pulmonary effort is normal. No respiratory distress.     Breath sounds: Normal breath sounds. No wheezing.  Abdominal:     General: Bowel sounds are normal. There is no distension or abdominal bruit.     Palpations: Abdomen is soft. There is no hepatomegaly or splenomegaly.     Tenderness: There is no abdominal tenderness. There is no right CVA tenderness or left CVA tenderness.     Hernia: No hernia is present.  Genitourinary:    Comments: Deferred Musculoskeletal:        General: Normal range of motion.     Cervical back: Normal range of motion and neck supple.     Right lower leg: No edema.     Left lower leg: No edema.  Lymphadenopathy:     Cervical: No cervical adenopathy.  Skin:    General: Skin is warm and dry.     Capillary Refill: Capillary refill takes less than 2 seconds.     Coloration: Skin is not cyanotic, jaundiced or  pale.     Findings: No rash.  Neurological:     General: No focal deficit present.     Mental Status: She is alert and oriented to person, place, and time.     Cranial Nerves: Cranial nerves are intact. No cranial nerve deficit.     Sensory: Sensation is intact. No sensory deficit.     Motor: Motor function is intact. No weakness.     Coordination: Coordination is intact. Coordination normal.     Gait: Gait is intact. Gait normal.     Deep Tendon Reflexes: Reflexes are normal and symmetric. Reflexes normal.  Psychiatric:        Attention and Perception: Attention and perception normal.        Mood and Affect: Mood and affect normal.        Speech: Speech normal.        Behavior: Behavior normal. Behavior is cooperative.        Thought Content: Thought content normal.        Cognition and Memory: Cognition and memory normal.        Judgment: Judgment normal.    Results for orders placed or performed during the hospital encounter of 07/15/20  D-dimer, quantitative (not at [facility])  Result Value Ref Range   D-Dimer, Quant 1.10 (H) 0.00 - 0.50 ug/mL-FEU       Pertinent labs & imaging results that were available during my care of the patient were reviewed by me and considered in my medical decision making.  Assessment & Plan:  Naevia was seen today for new patient (initial visit).  Diagnoses and all orders for this visit:  Annual physical exam Influenza vaccination administered at current visit Screening for HIV without presence of risk factors Need for hepatitis C screening test Encounter for colorectal cancer screening Influenza vaccination given today. Mammogram scheduled for 04/2021. PAP due 2023. Cologuard ordered today. Labs pending. Health maintenance along with diet and exercise discussed.  -     CBC with Differential/Platelet -     CMP14+EGFR -     Lipid panel -     Thyroid Panel With TSH -  Hepatitis C antibody -     HIV Antibody (routine testing w rflx) -      Hormone Panel -     Cologuard  Irregular menses Ongoing for several years. Will check below labs. Pt is s/p BTL several years ago, no chance of pregnancy. -     CBC with Differential/Platelet -     Thyroid Panel With TSH -     Hormone Panel    Continue all other maintenance medications.  Follow up plan: Return in about 6 weeks (around 04/27/2021), or if symptoms worsen or fail to improve.   Continue healthy lifestyle choices, including diet (rich in fruits, vegetables, and lean proteins, and low in salt and simple carbohydrates) and exercise (at least 30 minutes of moderate physical activity daily).  Educational handout given for health maintenance  The above assessment and management plan was discussed with the patient. The patient verbalized understanding of and has agreed to the management plan. Patient is aware to call the clinic if they develop any new symptoms or if symptoms persist or worsen. Patient is aware when to return to the clinic for a follow-up visit. Patient educated on when it is appropriate to go to the emergency department.   Monia Pouch, FNP-C [facility] [phone]

## 2021-04-01 LAB — CBC WITH DIFFERENTIAL/PLATELET
Basophils Absolute: 0 10*3/uL (ref 0.0–0.2)
Basos: 0 %
EOS (ABSOLUTE): 0 10*3/uL (ref 0.0–0.4)
Eos: 0 %
Hematocrit: 37.5 % (ref 34.0–46.6)
Hemoglobin: 12.1 g/dL (ref 11.1–15.9)
Immature Grans (Abs): 0 10*3/uL (ref 0.0–0.1)
Immature Granulocytes: 0 %
Lymphocytes Absolute: 0.4 10*3/uL — ABNORMAL LOW (ref 0.7–3.1)
Lymphs: 6 %
MCH: 26.8 pg (ref 26.6–33.0)
MCHC: 32.3 g/dL (ref 31.5–35.7)
MCV: 83 fL (ref 79–97)
Monocytes Absolute: 0.8 10*3/uL (ref 0.1–0.9)
Monocytes: 12 %
Neutrophils Absolute: 5.3 10*3/uL (ref 1.4–7.0)
Neutrophils: 82 %
Platelets: 296 10*3/uL (ref 150–450)
RBC: 4.52 x10E6/uL (ref 3.77–5.28)
RDW: 14.9 % (ref 11.7–15.4)
WBC: 6.5 10*3/uL (ref 3.4–10.8)

## 2021-04-01 LAB — HEPATITIS C ANTIBODY: Hep C Virus Ab: <0.1 s/co ratio (ref 0.0–0.9)

## 2021-04-01 LAB — LIPID PANEL
Chol/HDL Ratio: 1.8 ratio (ref 0.0–4.4)
Cholesterol, Total: 146 mg/dL (ref 100–199)
HDL: 79 mg/dL (ref >39)
LDL Chol Calc (NIH): 58 mg/dL (ref 0–99)
Triglycerides: 38 mg/dL (ref 0–149)
VLDL Cholesterol Cal: 9 mg/dL (ref 5–40)

## 2021-04-01 LAB — HORMONE PANEL (T4,TSH,FSH,TESTT,SHBG,DHEA,ETC)
DHEA-Sulfate, LCMS: 11 ug/dL
Estradiol, Serum, MS: 119 pg/mL
Estrone Sulfate: 142 ng/dL
Follicle Stimulating Hormone: 5.2 m[IU]/mL
Free T-3: 2.4 pg/mL
Free Testosterone, Serum: 0.2 pg/mL — ABNORMAL LOW
Progesterone, Serum: <10 ng/dL
Sex Hormone Binding Globulin: 93.3 nmol/L
T4: 9 ug/dL
TSH: 1.4 uU/mL
Testosterone, Serum (Total): 5.9 ng/dL
Testosterone-% Free: 0.4 %
Triiodothyronine (T-3), Serum: 88 ng/dL

## 2021-04-01 LAB — CMP14+EGFR
ALT: 11 IU/L (ref 0–32)
AST: 14 IU/L (ref 0–40)
Albumin/Globulin Ratio: 1.6 (ref 1.2–2.2)
Albumin: 4.2 g/dL (ref 3.8–4.8)
Alkaline Phosphatase: 72 IU/L (ref 44–121)
BUN/Creatinine Ratio: 14 (ref 9–23)
BUN: 11 mg/dL (ref 6–24)
Bilirubin Total: <0.2 mg/dL (ref 0.0–1.2)
CO2: 22 mmol/L (ref 20–29)
Calcium: 8.6 mg/dL — ABNORMAL LOW (ref 8.7–10.2)
Chloride: 101 mmol/L (ref 96–106)
Creatinine, Ser: 0.77 mg/dL (ref 0.57–1.00)
Globulin, Total: 2.6 g/dL (ref 1.5–4.5)
Glucose: 95 mg/dL (ref 65–99)
Potassium: 4 mmol/L (ref 3.5–5.2)
Sodium: 139 mmol/L (ref 134–144)
Total Protein: 6.8 g/dL (ref 6.0–8.5)
eGFR: 96 mL/min/{1.73_m2} (ref >59)

## 2021-04-01 LAB — HIV ANTIBODY (ROUTINE TESTING W REFLEX): HIV Screen 4th Generation wRfx: NONREACTIVE

## 2021-04-01 LAB — THYROID PANEL WITH TSH
Free Thyroxine Index: 2.3 (ref 1.2–4.9)
T3 Uptake Ratio: 25 % (ref 24–39)
T4, Total: 9.1 ug/dL (ref 4.5–12.0)
TSH: 1.27 u[IU]/mL (ref 0.450–4.500)

## 2021-04-28 ENCOUNTER — Encounter: Payer: Self-pay | Admitting: Family Medicine

## 2021-04-28 ENCOUNTER — Other Ambulatory Visit: Payer: Self-pay

## 2021-04-28 ENCOUNTER — Ambulatory Visit
Admission: RE | Admit: 2021-04-28 | Discharge: 2021-04-28 | Disposition: A | Discharge status: Home / Self Care | Payer: Managed Care, Other (non HMO) | Source: Ambulatory Visit | Attending: Family Medicine | Admitting: Family Medicine

## 2021-04-28 ENCOUNTER — Ambulatory Visit (INDEPENDENT_AMBULATORY_CARE_PROVIDER_SITE_OTHER): Payer: Managed Care, Other (non HMO) | Admitting: Family Medicine

## 2021-04-28 VITALS — BP 96/66 | HR 78 | Temp 98.1°F | Ht 63.0 in | Wt 120.0 lb

## 2021-04-28 DIAGNOSIS — R921 Mammographic calcification found on diagnostic imaging of breast: Secondary | ICD-10-CM

## 2021-04-28 DIAGNOSIS — N926 Irregular menstruation, unspecified: Secondary | ICD-10-CM

## 2021-04-28 DIAGNOSIS — K219 Gastro-esophageal reflux disease without esophagitis: Secondary | ICD-10-CM

## 2021-04-28 MED ORDER — OMEPRAZOLE 20 MG PO CPDR: 20.0000 mg | DELAYED_RELEASE_CAPSULE | Freq: Every day | ORAL | 3 refills | Status: DC

## 2021-04-28 NOTE — Progress Notes (Signed)
Subjective:  Patient ID: Sierra Williams, female    DOB: Jul 29, 1975, 45 y.o.   MRN: [id number]  Patient Care Team: Baruch Gouty, FNP as PCP - General (Family Medicine)   Chief Complaint:  Follow-up   HPI: Sierra Williams is a 45 y.o. female presenting on 04/28/2021 for Follow-up   Pt presents today for follow up for irregular menses. Pt states her cycle is still irregular, states she will have 2 heavy days and then 2 days of spotting. No cramping or possibility of pregnancy as she had a BTL. She does not wish to start medications as this time.  She would also like a refill on her omeprazole. States her acid reflux is well controlled on current regimen. No dysphagia, cough, voice change, hemoptysis, abdominal pain, weight loss, melena, or hematochezia.  She has started a calcium and vit D supplement and is tolerating well.  She has received her cologuard but has not completed it. Her mammogram is scheduled.    Relevant past medical, surgical, family, and social history reviewed and updated as indicated.  Allergies and medications reviewed and updated. Data reviewed: Chart in Epic.   Past Medical History:  Diagnosis Date   Arthritis    Collapsed lung    Lupus ([place]) 2004   Otitis media, serous, TM rupture    Reflux     Past Surgical History:  Procedure Laterality Date   BIOPSY  04/10/2018   Procedure: BIOPSY;  Surgeon: Daneil Dolin, MD;  [facility];  Service: Endoscopy;;  esophagus _0 ,33   CHEST TUBE INSERTION     ESOPHAGOGASTRODUODENOSCOPY N/A 04/10/2018   Dr. Gala Romney: Mild Schatzki ring in the mid esophagus status post dilation.  Salmon colored mucosa present circumferentially 3 cm above the GE junction.  Small hiatal hernia.  Biopsies consistent with mild chronic inflammation, reactive changes.  Intestinal metaplasia present, negative for dysplasia.  Gastric mucosa with mild inflammation.  Per Dr. Gala Romney: Bx not impressive for diagnosis of Barrett's   left  VATS for pneumothorax  2011   LUNG SURGERY Left 2011   [place] N/A 04/10/2018   Procedure: Venia Minks DILATION;  Surgeon: Daneil Dolin, MD;  Location: [facility];  Service: Endoscopy;  Laterality: N/A;   TUBAL LIGATION     WRIST SURGERY Right     Social History   Socioeconomic History   Marital status: Married    Spouse name: Not on file   Number of children: Not on file   Years of education: Not on file   Highest education level: Not on file  Occupational History   Not on file  Tobacco Use   Smoking status: Former   Smokeless tobacco: Former    Quit date: 03/27/2010  Vaping Use   Vaping Use: Never used  Substance and Sexual Activity   Alcohol use: Yes    Comment: occas   Drug use: Never   Sexual activity: Yes    Birth control/protection: Surgical  Other Topics Concern   Not on file  Social History Narrative   Not on file   Social Determinants of Health   Financial Resource Strain: Not on file  Food Insecurity: Not on file  Transportation Needs: Not on file  Physical Activity: Not on file  Stress: Not on file  Social Connections: Not on file  Intimate Partner Violence: Not on file    Outpatient Encounter Medications as of 04/28/2021  Medication Sig   hydroxychloroquine (PLAQUENIL) 200 MG tablet  Take by mouth daily.   [DISCONTINUED] omeprazole (PRILOSEC) 20 MG capsule TAKE 1 CAPSULE BY MOUTH ONCE DAILY BEFORE BREAKFAST   omeprazole (PRILOSEC) 20 MG capsule Take 1 capsule (20 mg total) by mouth daily.   No facility-administered encounter medications on file as of 04/28/2021.    No Known Allergies  Review of Systems  Constitutional:  Negative for activity change, appetite change, chills, diaphoresis, fatigue, fever and unexpected weight change.  HENT: Negative.    Eyes: Negative.   Respiratory:  Negative for cough, chest tightness and shortness of breath.   Cardiovascular:  Negative for chest pain, palpitations and leg swelling.  Gastrointestinal:   Negative for abdominal pain, blood in stool, constipation, diarrhea, nausea and vomiting.  Endocrine: Negative.   Genitourinary:  Positive for menstrual problem. Negative for decreased urine volume, difficulty urinating, dysuria, frequency and urgency.  Musculoskeletal:  Negative for arthralgias and myalgias.  Skin: Negative.   Allergic/Immunologic: Negative.   Neurological:  Negative for dizziness, weakness and headaches.  Hematological: Negative.   Psychiatric/Behavioral:  Negative for confusion, hallucinations, sleep disturbance and suicidal ideas.   All other systems reviewed and are negative.      Objective:  BP 96/66   Pulse 78   Temp 98.1 F (36.7 C)   Ht 5' 3" (1.6 m)   Wt 120 lb (54.4 kg)   LMP 04/13/2021 (Exact Date)   SpO2 98%   BMI 21.26 kg/m    Wt Readings from Last 3 Encounters:  04/28/21 120 lb (54.4 kg)  03/16/21 119 lb 12.8 oz (54.3 kg)  07/15/20 123 lb 6.4 oz (56 kg)    Physical Exam Vitals and nursing note reviewed.  Constitutional:      General: She is not in acute distress.    Appearance: Normal appearance. She is well-developed and well-groomed. She is not ill-appearing, toxic-appearing or diaphoretic.  HENT:     Head: Normocephalic and atraumatic.     Jaw: There is normal jaw occlusion.     Right Ear: Hearing normal.     Left Ear: Hearing normal.     Nose: Nose normal.     Mouth/Throat:     Lips: Pink.     Mouth: Mucous membranes are moist.     Pharynx: Oropharynx is clear. Uvula midline.  Eyes:     General: Lids are normal.     Extraocular Movements: Extraocular movements intact.     Conjunctiva/sclera: Conjunctivae normal.     Pupils: Pupils are equal, round, and reactive to light.  Neck:     Thyroid: No thyroid mass, thyromegaly or thyroid tenderness.     Vascular: No carotid bruit or JVD.     Trachea: Trachea and phonation normal.  Cardiovascular:     Rate and Rhythm: Normal rate and regular rhythm.     Chest Wall: PMI is not  displaced.     Pulses: Normal pulses.     Heart sounds: Normal heart sounds. No murmur heard.   No friction rub. No gallop.  Pulmonary:     Effort: Pulmonary effort is normal. No respiratory distress.     Breath sounds: Normal breath sounds. No wheezing.  Abdominal:     General: There is no abdominal bruit.     Palpations: There is no hepatomegaly or splenomegaly.  Musculoskeletal:     Cervical back: Normal range of motion and neck supple.     Right lower leg: No edema.     Left lower leg: No edema.  Lymphadenopathy:  Cervical: No cervical adenopathy.  Skin:    General: Skin is warm and dry.     Capillary Refill: Capillary refill takes less than 2 seconds.     Coloration: Skin is not cyanotic, jaundiced or pale.     Findings: No rash.  Neurological:     General: No focal deficit present.     Mental Status: She is alert and oriented to person, place, and time.     Sensory: Sensation is intact.     Motor: Motor function is intact.     Coordination: Coordination is intact.     Gait: Gait is intact.     Deep Tendon Reflexes: Reflexes are normal and symmetric.  Psychiatric:        Attention and Perception: Attention and perception normal.        Mood and Affect: Mood and affect normal.        Speech: Speech normal.        Behavior: Behavior normal. Behavior is cooperative.        Thought Content: Thought content normal.        Cognition and Memory: Cognition and memory normal.        Judgment: Judgment normal.    Results for orders placed or performed in visit on 03/16/21  CBC with Differential/Platelet  Result Value Ref Range   WBC 6.5 3.4 - 10.8 x10E3/uL   RBC 4.52 3.77 - 5.28 x10E6/uL   Hemoglobin 12.1 11.1 - 15.9 g/dL   Hematocrit 37.5 34.0 - 46.6 %   MCV 83 79 - 97 fL   MCH 26.8 26.6 - 33.0 pg   MCHC 32.3 31.5 - 35.7 g/dL   RDW 14.9 11.7 - 15.4 %   Platelets 296 150 - 450 x10E3/uL   Neutrophils 82 Not Estab. %   Lymphs 6 Not Estab. %   Monocytes 12 Not Estab. %    Eos 0 Not Estab. %   Basos 0 Not Estab. %   Neutrophils Absolute 5.3 1.4 - 7.0 x10E3/uL   Lymphocytes Absolute 0.4 (L) 0.7 - 3.1 x10E3/uL   Monocytes Absolute 0.8 0.1 - 0.9 x10E3/uL   EOS (ABSOLUTE) 0.0 0.0 - 0.4 x10E3/uL   Basophils Absolute 0.0 0.0 - 0.2 x10E3/uL   Immature Granulocytes 0 Not Estab. %   Immature Grans (Abs) 0.0 0.0 - 0.1 x10E3/uL  CMP14+EGFR  Result Value Ref Range   Glucose 95 65 - 99 mg/dL   BUN 11 6 - 24 mg/dL   Creatinine, Ser 0.77 0.57 - 1.00 mg/dL   eGFR 96 >59 mL/min/1.73   BUN/Creatinine Ratio 14 9 - 23   Sodium 139 134 - 144 mmol/L   Potassium 4.0 3.5 - 5.2 mmol/L   Chloride 101 96 - 106 mmol/L   CO2 22 20 - 29 mmol/L   Calcium 8.6 (L) 8.7 - 10.2 mg/dL   Total Protein 6.8 6.0 - 8.5 g/dL   Albumin 4.2 3.8 - 4.8 g/dL   Globulin, Total 2.6 1.5 - 4.5 g/dL   Albumin/Globulin Ratio 1.6 1.2 - 2.2   Bilirubin Total <0.2 0.0 - 1.2 mg/dL   Alkaline Phosphatase 72 44 - 121 IU/L   AST 14 0 - 40 IU/L   ALT 11 0 - 32 IU/L  Lipid panel  Result Value Ref Range   Cholesterol, Total 146 100 - 199 mg/dL   Triglycerides 38 0 - 149 mg/dL   HDL 79 >39 mg/dL   VLDL Cholesterol Cal 9 5 - 40 mg/dL  LDL Chol Calc (NIH) 58 0 - 99 mg/dL   Chol/HDL Ratio 1.8 0.0 - 4.4 ratio  Thyroid Panel With TSH  Result Value Ref Range   TSH 1.270 0.450 - 4.500 uIU/mL   T4, Total 9.1 4.5 - 12.0 ug/dL   T3 Uptake Ratio 25 24 - 39 %   Free Thyroxine Index 2.3 1.2 - 4.9  Hepatitis C antibody  Result Value Ref Range   Hep C Virus Ab <0.1 0.0 - 0.9 s/co ratio  HIV Antibody (routine testing w rflx)  Result Value Ref Range   HIV Screen 4th Generation wRfx Non Reactive Non Reactive  Hormone Panel  Result Value Ref Range   TSH 1.4 uU/mL   T4 9.0 ug/dL   Free T-3 2.4 pg/mL   Triiodothyronine (T-3), Serum 88 ng/dL   Testosterone, Serum (Total) 5.9 ng/dL   Free Testosterone, Serum 0.2 (L) pg/mL   Testosterone-% Free 0.4 %   Follicle Stimulating Hormone 5.2 mIU/mL   Progesterone,  Serum <10 ng/dL   DHEA-Sulfate, LCMS 11 ug/dL   Sex Hormone Binding Globulin 93.3 nmol/L   Estrone Sulfate 142 ng/dL   Estradiol, Serum, MS 119 pg/mL       Pertinent labs & imaging results that were available during my care of the patient were reviewed by me and considered in my medical decision making.  Assessment & Plan:  Jaquay was seen today for follow-up.  Diagnoses and all orders for this visit:  Irregular menses Discussed perimenopause in detail. Hormone panel reviewed with pt. Pt voices understanding and does not wish to start any new medications.  She will make a follow up for CPE with PAP.   Gastroesophageal reflux disease without esophagitis No red flags present. Diet discussed. Avoid fried, spicy, fatty, greasy, and acidic foods. Avoid caffeine, nicotine, and alcohol. Do not eat 2-3 hours before bedtime and stay upright for at least 1-2 hours after eating. Eat small frequent meals. Avoid NSAID's like motrin and aleve. Medications as prescribed. Report any new or worsening symptoms. Follow up as discussed or sooner if needed.   -     omeprazole (PRILOSEC) 20 MG capsule; Take 1 capsule (20 mg total) by mouth daily.     Continue all other maintenance medications.  Follow up plan: Return in about 8 months (around 12/26/2021), or if symptoms worsen or fail to improve, for CPE with PAP.   Continue healthy lifestyle choices, including diet (rich in fruits, vegetables, and lean proteins, and low in salt and simple carbohydrates) and exercise (at least 30 minutes of moderate physical activity daily).  Educational handout given for perimenopause, GERD  The above assessment and management plan was discussed with the patient. The patient verbalized understanding of and has agreed to the management plan. Patient is aware to call the clinic if they develop any new symptoms or if symptoms persist or worsen. Patient is aware when to return to the clinic for a follow-up visit. Patient  educated on when it is appropriate to go to the emergency department.   Monia Pouch, FNP-C [facility] [phone]

## 2021-05-03 ENCOUNTER — Encounter: Payer: Self-pay | Admitting: Family Medicine

## 2021-09-15 ENCOUNTER — Encounter: Payer: Self-pay | Admitting: Gastroenterology

## 2021-09-20 ENCOUNTER — Encounter: Payer: Self-pay | Admitting: Family Medicine

## 2022-03-08 ENCOUNTER — Encounter: Payer: Self-pay | Admitting: Family Medicine

## 2022-03-17 ENCOUNTER — Other Ambulatory Visit (HOSPITAL_COMMUNITY)
Admission: RE | Admit: 2022-03-17 | Discharge: 2022-03-17 | Disposition: A | Discharge status: Home / Self Care | Payer: 59 | Source: Ambulatory Visit | Attending: Family Medicine | Admitting: Family Medicine

## 2022-03-17 ENCOUNTER — Encounter: Payer: Self-pay | Admitting: Family Medicine

## 2022-03-17 ENCOUNTER — Ambulatory Visit (INDEPENDENT_AMBULATORY_CARE_PROVIDER_SITE_OTHER): Payer: 59 | Admitting: Family Medicine

## 2022-03-17 VITALS — BP 99/63 | HR 70 | Temp 98.8°F | Ht 63.0 in | Wt 122.0 lb

## 2022-03-17 DIAGNOSIS — Z1231 Encounter for screening mammogram for malignant neoplasm of breast: Secondary | ICD-10-CM | POA: Diagnosis not present

## 2022-03-17 DIAGNOSIS — Z1211 Encounter for screening for malignant neoplasm of colon: Secondary | ICD-10-CM | POA: Diagnosis not present

## 2022-03-17 DIAGNOSIS — Z124 Encounter for screening for malignant neoplasm of cervix: Secondary | ICD-10-CM

## 2022-03-17 DIAGNOSIS — Z0001 Encounter for general adult medical examination with abnormal findings: Secondary | ICD-10-CM

## 2022-03-17 DIAGNOSIS — Z23 Encounter for immunization: Secondary | ICD-10-CM | POA: Diagnosis not present

## 2022-03-17 DIAGNOSIS — Z0000 Encounter for general adult medical examination without abnormal findings: Secondary | ICD-10-CM | POA: Insufficient documentation

## 2022-03-17 DIAGNOSIS — Z1212 Encounter for screening for malignant neoplasm of rectum: Secondary | ICD-10-CM

## 2022-03-17 DIAGNOSIS — K219 Gastro-esophageal reflux disease without esophagitis: Secondary | ICD-10-CM

## 2022-03-17 MED ORDER — OMEPRAZOLE 20 MG PO CPDR: 20.0000 mg | DELAYED_RELEASE_CAPSULE | Freq: Every day | ORAL | 3 refills | Status: DC

## 2022-03-17 NOTE — Progress Notes (Signed)
Sierra Williams is a 46 y.o. female presents to office today for annual physical exam examination.    Concerns today include: 1. None Labs from rheumatology visit 03/07/2022 revealed normal CBC and CMP.  Occupation: Sierra Williams Marital status: M Substance use: denies Diet: variety Exercise: not regular Last eye exam: has upcoming appointment  Last dental exam: twice yearly Last colonoscopy: never, cologuard ordered today Last mammogram: 2022, ordered today Last pap smear: 2020, completed today Refills needed today: omeprazole Immunizations needed:  Flu Vaccine: yes, given today  Tdap Vaccine: yes, given today   Past Medical History:  Diagnosis Date   Arthritis    Collapsed lung    Lupus ([facility]) 2004   Otitis media, serous, TM rupture    Reflux    Social History   Socioeconomic History   Marital status: Married    Spouse name: Not on file   Number of children: Not on file   Years of education: Not on file   Highest education level: Not on file  Occupational History   Not on file  Tobacco Use   Smoking status: Former   Smokeless tobacco: Former    Quit date: 03/27/2010  Vaping Use   Vaping Use: Never used  Substance and Sexual Activity   Alcohol use: Yes    Comment: occas   Drug use: Never   Sexual activity: Yes    Birth control/protection: Surgical  Other Topics Concern   Not on file  Social History Narrative   Not on file   Social Determinants of Health   Financial Resource Strain: Not on file  Food Insecurity: Not on file  Transportation Needs: Not on file  Physical Activity: Not on file  Stress: Not on file  Social Connections: Not on file  Intimate Partner Violence: Not on file   Past Surgical History:  Procedure Laterality Date   BIOPSY  04/10/2018   Procedure: BIOPSY;  Surgeon: Sierra Ade, MD;  [facility];  Service: Endoscopy;;  esophagus @31 ,33   CHEST TUBE INSERTION     ESOPHAGOGASTRODUODENOSCOPY N/A 04/10/2018   Dr.  Jena Williams: Mild Schatzki ring in the mid esophagus status post dilation.  Salmon colored mucosa present circumferentially 3 cm above the GE junction.  Small hiatal hernia.  Biopsies consistent with mild chronic inflammation, reactive changes.  Intestinal metaplasia present, negative for dysplasia.  Gastric mucosa with mild inflammation.  Per Dr. Jena Williams: Bx not impressive for diagnosis of Barrett's   left VATS for pneumothorax  2011   LUNG SURGERY Left 2011   MALONEY DILATION N/A 04/10/2018   Procedure: Sierra Williams DILATION;  Surgeon: Sierra Ade, MD;  [facility];  Service: Endoscopy;  Laterality: N/A;   TUBAL LIGATION     WRIST SURGERY Right    Family History  Problem Relation Age of Onset   Hypertension Maternal Grandmother    Hyperlipidemia Maternal Grandmother    Heart attack Maternal Grandfather    Hypertension Maternal Grandfather    Hyperlipidemia Maternal Grandfather    Hypertension Paternal Grandmother    Hyperlipidemia Paternal Grandmother    Heart attack Paternal Grandfather    Hypertension Paternal Grandfather    Hyperlipidemia Paternal Grandfather    Cancer Other        lung   Cancer Other        unknown primary   Melanoma Maternal Uncle    Colon cancer Neg Hx     Current Outpatient Medications:    hydroxychloroquine (PLAQUENIL) 200 MG  tablet, Take by mouth daily., Disp: , Rfl:    omeprazole (PRILOSEC) 20 MG capsule, Take 1 capsule (20 mg total) by mouth daily., Disp: 90 capsule, Rfl: 3  No Known Allergies    Review of Systems Constitutional: positive for fatigue Eyes: positive for visual changes, has appointment with eye doctor Ears, nose, mouth, throat, and face: negative Respiratory: negative Cardiovascular: negative Gastrointestinal: negative Genitourinary:negative Integument/breast: negative Hematologic/lymphatic: telangiectasias to nose Musculoskeletal:positive for arthralgias and myalgias Neurological: negative Behavioral/Psych:  negative Endocrine: negative Allergic/Immunologic: negative    Physical exam BP 99/63   Pulse 70   Temp 98.8 F (37.1 C)   Ht  (1.6 m)   Wt 122 lb (55.3 kg)   SpO2 99%   BMI 21.61 kg/m  General appearance: alert, cooperative, appears stated age, and no distress Head: Normocephalic, without obvious abnormality, atraumatic Eyes: conjunctivae/corneas clear. PERRL, EOM's intact. Fundi benign. Ears: normal TM's and external ear canals both ears Nose: Nares normal. Septum midline. Mucosa normal. No drainage or sinus tenderness. Throat: lips, mucosa, and tongue normal; teeth and gums normal Neck: no adenopathy, no carotid bruit, no JVD, supple, symmetrical, trachea midline, and thyroid not enlarged, symmetric, no tenderness/mass/nodules Back: symmetric, no curvature. ROM normal. No CVA tenderness. Lungs: clear to auscultation bilaterally and normal percussion bilaterally Breasts: normal appearance, no masses or tenderness Heart: regular rate and rhythm, S1, S2 normal, no murmur, click, rub or gallop Abdomen: soft, non-tender; bowel sounds normal; no masses,  no organomegaly Pelvic: adnexae not palpable, cervix normal in appearance, external genitalia normal, no adnexal masses or tenderness, no cervical motion tenderness, perianal skin: no external genital warts noted, rectovaginal septum normal, uterus normal size, shape, and consistency, vagina normal without discharge, and cervix parous Extremities: extremities normal, atraumatic, no cyanosis or edema Pulses: 2+ and symmetric Skin: Skin color, texture, turgor normal. No rashes or lesions Lymph nodes: Cervical, supraclavicular, and axillary nodes normal. Neurologic: Alert and oriented X 3, normal strength and tone. Normal symmetric reflexes. Normal coordination and gait    Assessment/ Plan: Sierra Williams here for annual physical exam.   Sierra Williams was seen today for annual exam.  Diagnoses and all orders for this visit:  Annual  physical exam Health maintenance discussed in detail and updated today. CBC and CMP at rheumatology office normal. Other labs pending. Mammogram and cologuard ordered.  -     MM Digital Screening; Future -     Cytology - PAP -     Lipid panel -     Thyroid Panel With TSH -     Cologuard  Screening for cervical cancer -     Cytology - PAP  Encounter for screening mammogram for malignant neoplasm of breast -     MM Digital Screening; Future  Screening for colorectal cancer -     Cologuard  Need for Td vaccine -     Td : Tetanus/diphtheria >7yo Preservative  free  Need for influenza vaccination  Gastroesophageal reflux disease without esophagitis -     omeprazole (PRILOSEC) 20 MG capsule; Take 1 capsule (20 mg total) by mouth daily.     Counseled on healthy lifestyle choices, including diet (rich in fruits, vegetables and lean meats and low in salt and simple carbohydrates) and exercise (at least 30 minutes of moderate physical activity daily).  Patient to follow up in 1 year for annual exam or sooner if needed.  The above assessment and management plan was discussed with the patient. The patient verbalized understanding of and  has agreed to the management plan. Patient is aware to call the clinic if symptoms persist or worsen. Patient is aware when to return to the clinic for a follow-up visit. Patient educated on when it is appropriate to go to the emergency department.   Monia Pouch, FNP-C [facility] [address] [place], [place] [address] [phone]

## 2022-03-17 NOTE — Patient Instructions (Signed)
  Cologuard  Your provider has prescribed Cologuard, an easy-to-use, noninvasive test for colon cancer screening, based on the latest advances in stool DNA science. It appears you have received the kit but have not returned it to [employer]. Support options for help with completing the screening are provided below. Please use them to assist with completing the screening as this is important.   Patient Support Screening for colon cancer is very important to your good health, so if you have any questions at all, please call [employer] [employer] [facility] at [phone]. They are available 24 hours a day, 6 days a week. An instructional video is available to view online at [email or web address] (Please copy and past to web browser to view video).

## 2022-03-18 LAB — THYROID PANEL WITH TSH
Free Thyroxine Index: 2.2 (ref 1.2–4.9)
T3 Uptake Ratio: 25 % (ref 24–39)
T4, Total: 8.7 ug/dL (ref 4.5–12.0)
TSH: 3.11 u[IU]/mL (ref 0.450–4.500)

## 2022-03-18 LAB — LIPID PANEL
Chol/HDL Ratio: 3.2 ratio (ref 0.0–4.4)
Cholesterol, Total: 178 mg/dL (ref 100–199)
HDL: 56 mg/dL (ref >39)
LDL Chol Calc (NIH): 107 mg/dL — ABNORMAL HIGH (ref 0–99)
Triglycerides: 81 mg/dL (ref 0–149)
VLDL Cholesterol Cal: 15 mg/dL (ref 5–40)

## 2022-03-20 ENCOUNTER — Other Ambulatory Visit: Payer: Self-pay | Admitting: *Deleted

## 2022-03-20 DIAGNOSIS — Z23 Encounter for immunization: Secondary | ICD-10-CM

## 2022-03-21 ENCOUNTER — Other Ambulatory Visit: Payer: Self-pay | Admitting: Family Medicine

## 2022-03-21 DIAGNOSIS — R928 Other abnormal and inconclusive findings on diagnostic imaging of breast: Secondary | ICD-10-CM

## 2022-03-22 LAB — CYTOLOGY - PAP
Chlamydia: NEGATIVE
Comment: NEGATIVE
Comment: NEGATIVE
Comment: NEGATIVE
Comment: NORMAL
Diagnosis: NEGATIVE
High risk HPV: NEGATIVE
Neisseria Gonorrhea: NEGATIVE
Trichomonas: NEGATIVE

## 2022-04-21 ENCOUNTER — Encounter: Payer: Self-pay | Admitting: Family Medicine

## 2022-05-08 ENCOUNTER — Ambulatory Visit
Admission: RE | Admit: 2022-05-08 | Discharge: 2022-05-08 | Disposition: A | Discharge status: Home / Self Care | Payer: 59 | Source: Ambulatory Visit | Attending: Family Medicine | Admitting: Family Medicine

## 2022-05-08 DIAGNOSIS — R928 Other abnormal and inconclusive findings on diagnostic imaging of breast: Secondary | ICD-10-CM

## 2022-05-15 LAB — HM DIABETES EYE EXAM

## 2022-05-25 LAB — COLOGUARD: COLOGUARD: NEGATIVE

## 2023-03-08 LAB — LAB REPORT - SCANNED: EGFR: 82

## 2023-03-20 ENCOUNTER — Ambulatory Visit (INDEPENDENT_AMBULATORY_CARE_PROVIDER_SITE_OTHER): Payer: 59 | Admitting: Family Medicine

## 2023-03-20 ENCOUNTER — Encounter: Payer: Self-pay | Admitting: Family Medicine

## 2023-03-20 VITALS — BP 122/73 | HR 102 | Temp 98.0°F | Ht 63.0 in | Wt 122.0 lb

## 2023-03-20 DIAGNOSIS — Z23 Encounter for immunization: Secondary | ICD-10-CM | POA: Diagnosis not present

## 2023-03-20 DIAGNOSIS — N926 Irregular menstruation, unspecified: Secondary | ICD-10-CM | POA: Diagnosis not present

## 2023-03-20 DIAGNOSIS — Z0001 Encounter for general adult medical examination with abnormal findings: Secondary | ICD-10-CM | POA: Diagnosis not present

## 2023-03-20 DIAGNOSIS — K219 Gastro-esophageal reflux disease without esophagitis: Secondary | ICD-10-CM

## 2023-03-20 DIAGNOSIS — Z1231 Encounter for screening mammogram for malignant neoplasm of breast: Secondary | ICD-10-CM

## 2023-03-20 DIAGNOSIS — Z0000 Encounter for general adult medical examination without abnormal findings: Secondary | ICD-10-CM

## 2023-03-20 DIAGNOSIS — Z13220 Encounter for screening for lipoid disorders: Secondary | ICD-10-CM

## 2023-03-20 MED ORDER — OMEPRAZOLE 20 MG PO CPDR: 20.0000 mg | DELAYED_RELEASE_CAPSULE | Freq: Every day | ORAL | 3 refills | Status: DC

## 2023-03-20 NOTE — Progress Notes (Signed)
Complete physical exam  Patient: Sierra Williams   DOB: [birth year]   47 y.o. Female  MRN: [id number]  Subjective:    Chief Complaint  Patient presents with   Annual Exam   Menstrual Problem    Sierra Williams is a 47 y.o. female who presents today for a complete physical exam. She reports consuming a general diet. The patient has a physically strenuous job, but has no regular exercise apart from work.  She generally feels well. She reports sleeping well. She does have additional problems to discuss today.  Irregular menses: states May, June, and July she had 2 cycles per month with painful cramps for 2 days. States this has since subsided. Did have some hot flashes during this time. No other associated symptoms.   Most recent fall risk assessment:    03/20/2023    2:25 PM  Fall Risk   Falls in the past year? 0     Most recent depression screenings:    03/20/2023    2:25 PM 03/17/2022    2:32 PM  PHQ 2/9 Scores  PHQ - 2 Score 0 0  PHQ- 9 Score 2 1    Vision:Within last year and Dental: No current dental problems and Receives regular dental care  Patient Active Problem List   Diagnosis Date Noted   Disorder of lumbosacral intervertebral disc 03/16/2021   Chronic low back pain 03/16/2021   RA (rheumatoid arthritis) (HCC) 03/16/2021   Systemic lupus erythematosus (HCC) 03/15/2019   Dysmenorrhea 03/15/2019   GERD (gastroesophageal reflux disease) 03/01/2018   Thyroid nodule 01/21/2018   Past Medical History:  Diagnosis Date   Arthritis    Collapsed lung    Lupus (HCC) 2004   Otitis media, serous, TM rupture    Reflux    Past Surgical History:  Procedure Laterality Date   BIOPSY  04/10/2018   Procedure: BIOPSY;  Surgeon: Corbin Ade, MD;  [facility];  Service: Endoscopy;;  esophagus @31 ,33   CHEST TUBE INSERTION     ESOPHAGOGASTRODUODENOSCOPY N/A 04/10/2018   Dr. Jena Gauss: Mild Schatzki ring in the mid esophagus status post dilation.  Salmon colored  mucosa present circumferentially 3 cm above the GE junction.  Small hiatal hernia.  Biopsies consistent with mild chronic inflammation, reactive changes.  Intestinal metaplasia present, negative for dysplasia.  Gastric mucosa with mild inflammation.  Per Dr. Jena Gauss: Bx not impressive for diagnosis of Barrett's   left VATS for pneumothorax  2011   LUNG SURGERY Left 2011   MALONEY DILATION N/A 04/10/2018   Procedure: [name] DILATION;  Surgeon: Corbin Ade, MD;  [facility];  Service: Endoscopy;  Laterality: N/A;   TUBAL LIGATION     WRIST SURGERY Right    Social History   Tobacco Use   Smoking status: Former   Smokeless tobacco: Former    Quit date: 03/27/2010  Vaping Use   Vaping status: Never Used  Substance Use Topics   Alcohol use: Yes    Comment: occas   Drug use: Never   Social History   Socioeconomic History   Marital status: Married    Spouse name: Not on file   Number of children: Not on file   Years of education: Not on file   Highest education level: Not on file  Occupational History   Not on file  Tobacco Use   Smoking status: Former   Smokeless tobacco: Former    Quit date: 03/27/2010  Vaping Use  Vaping status: Never Used  Substance and Sexual Activity   Alcohol use: Yes    Comment: occas   Drug use: Never   Sexual activity: Yes    Birth control/protection: Surgical  Other Topics Concern   Not on file  Social History Narrative   Not on file   Social Determinants of Health   Financial Resource Strain: Not on file  Food Insecurity: Not on file  Transportation Needs: Not on file  Physical Activity: Not on file  Stress: Not on file  Social Connections: Not on file  Intimate Partner Violence: Not on file   Family Status  Relation Name Status   MGM  (Not Specified)   MGF  (Not Specified)   PGM  (Not Specified)   PGF  (Not Specified)   Other maternal grandmother (Not Specified)   Other paternal grandfather (Not Specified)   Mat  Uncle  (Not Specified)   Neg Hx  (Not Specified)  No partnership data on file   Family History  Problem Relation Age of Onset   Hypertension Maternal Grandmother    Hyperlipidemia Maternal Grandmother    Heart attack Maternal Grandfather    Hypertension Maternal Grandfather    Hyperlipidemia Maternal Grandfather    Hypertension Paternal Grandmother    Hyperlipidemia Paternal Grandmother    Heart attack Paternal Grandfather    Hypertension Paternal Grandfather    Hyperlipidemia Paternal Grandfather    Cancer Other        lung   Cancer Other        unknown primary   Melanoma Maternal Uncle    Colon cancer Neg Hx    No Known Allergies    Patient Care Team: Agustine Rossitto, Doralee Albino, FNP as PCP - General (Family Medicine)   Outpatient Medications Prior to Visit  Medication Sig   hydroxychloroquine (PLAQUENIL) 200 MG tablet Take by mouth daily.   [DISCONTINUED] omeprazole (PRILOSEC) 20 MG capsule Take 1 capsule (20 mg total) by mouth daily.   No facility-administered medications prior to visit.    Review of Systems  Constitutional:  Negative for chills, diaphoresis, fever, malaise/fatigue and weight loss.  Respiratory:  Negative for shortness of breath.   Cardiovascular:  Negative for palpitations, orthopnea, claudication, leg swelling and PND.  Genitourinary:  Negative for dysuria, flank pain, frequency, hematuria and urgency.       Irregular menses  All other systems reviewed and are negative.         Objective:     BP 122/73   Pulse (!) 102   Temp 98 F (36.7 C) (Temporal)   Ht 5\' 3"  (1.6 m)   Wt 122 lb (55.3 kg)   LMP 03/10/2023   SpO2 99%   BMI 21.61 kg/m  BP Readings from Last 3 Encounters:  03/20/23 122/73  03/17/22 99/63  04/28/21 96/66   Wt Readings from Last 3 Encounters:  03/20/23 122 lb (55.3 kg)  03/17/22 122 lb (55.3 kg)  04/28/21 120 lb (54.4 kg)   SpO2 Readings from Last 3 Encounters:  03/20/23 99%  03/17/22 99%  04/28/21 98%       Physical Exam Vitals and nursing note reviewed.  Constitutional:      General: She is not in acute distress.    Appearance: Normal appearance. She is well-developed, well-groomed and normal weight. She is not ill-appearing, toxic-appearing or diaphoretic.  HENT:     Head: Normocephalic and atraumatic.     Jaw: There is normal jaw occlusion.     Right  Ear: Hearing, tympanic membrane, ear canal and external ear normal.     Left Ear: Hearing, tympanic membrane, ear canal and external ear normal.     Nose: Nose normal.     Mouth/Throat:     Lips: Pink.     Mouth: Mucous membranes are moist.     Pharynx: Oropharynx is clear. Uvula midline.  Eyes:     General: Lids are normal.     Extraocular Movements: Extraocular movements intact.     Conjunctiva/sclera: Conjunctivae normal.     Pupils: Pupils are equal, round, and reactive to light.  Neck:     Thyroid: No thyroid mass, thyromegaly or thyroid tenderness.     Vascular: No carotid bruit or JVD.     Trachea: Trachea and phonation normal.  Cardiovascular:     Rate and Rhythm: Normal rate and regular rhythm.     Chest Wall: PMI is not displaced.     Pulses: Normal pulses.     Heart sounds: Normal heart sounds. No murmur heard.    No friction rub. No gallop.  Pulmonary:     Effort: Pulmonary effort is normal. No respiratory distress.     Breath sounds: Normal breath sounds. No wheezing.  Abdominal:     General: Bowel sounds are normal. There is no distension or abdominal bruit.     Palpations: Abdomen is soft. There is no hepatomegaly or splenomegaly.     Tenderness: There is no abdominal tenderness. There is no right CVA tenderness or left CVA tenderness.     Hernia: No hernia is present.  Genitourinary:    Comments: PAP up to date Musculoskeletal:        General: Normal range of motion.     Cervical back: Normal range of motion and neck supple.     Right lower leg: No edema.     Left lower leg: No edema.  Lymphadenopathy:      Cervical: No cervical adenopathy.  Skin:    General: Skin is warm and dry.     Capillary Refill: Capillary refill takes less than 2 seconds.     Coloration: Skin is not cyanotic, jaundiced or pale.     Findings: No rash.  Neurological:     General: No focal deficit present.     Mental Status: She is alert and oriented to person, place, and time.     Sensory: Sensation is intact.     Motor: Motor function is intact.     Coordination: Coordination is intact.     Gait: Gait is intact.     Deep Tendon Reflexes: Reflexes are normal and symmetric.  Psychiatric:        Attention and Perception: Attention and perception normal.        Mood and Affect: Mood and affect normal.        Speech: Speech normal.        Behavior: Behavior normal. Behavior is cooperative.        Thought Content: Thought content normal.        Cognition and Memory: Cognition and memory normal.        Judgment: Judgment normal.      Labs from rheumatology reviewed and are normal: CMP and CBC. Scanned to chart.  Last CBC Lab Results  Component Value Date   WBC 6.5 03/16/2021   HGB 12.1 03/16/2021   HCT 37.5 03/16/2021   MCV 83 03/16/2021   MCH 26.8 03/16/2021   RDW 14.9 03/16/2021   PLT  296 03/16/2021   Last metabolic panel Lab Results  Component Value Date   GLUCOSE 95 03/16/2021   NA 139 03/16/2021   K 4.0 03/16/2021   CL 101 03/16/2021   CO2 22 03/16/2021   BUN 11 03/16/2021   CREATININE 0.77 03/16/2021   EGFR 96 03/16/2021   CALCIUM 8.6 (L) 03/16/2021   PROT 6.8 03/16/2021   ALBUMIN 4.2 03/16/2021   LABGLOB 2.6 03/16/2021   AGRATIO 1.6 03/16/2021   BILITOT <0.2 03/16/2021   ALKPHOS 72 03/16/2021   AST 14 03/16/2021   ALT 11 03/16/2021   Last lipids Lab Results  Component Value Date   CHOL 178 03/17/2022   HDL 56 03/17/2022   LDLCALC 107 (H) 03/17/2022   TRIG 81 03/17/2022   CHOLHDL 3.2 03/17/2022   Last thyroid functions Lab Results  Component Value Date   TSH 3.110 03/17/2022    T4TOTAL 8.7 03/17/2022   Last vitamin D Lab Results  Component Value Date   VD25OH 44.2 01/21/2018      Assessment & Plan:    Routine Health Maintenance and Physical Exam  Immunization History  Administered Date(s) Administered   Influenza Inj Mdck Quad Pf 03/24/2017, 03/11/2019   Influenza Inj Mdck Quad With Preservative 04/22/2018   Influenza,inj,Quad PF,6+ Mos 03/15/2020, 03/16/2021, 03/17/2022   Janssen (J&J) SARS-COV-2 Vaccination 11/10/2019   Td 03/17/2022    Health Maintenance  Topic Date Due   INFLUENZA VACCINE  01/25/2023   COVID-19 Vaccine (2 - 2023-24 season) 04/05/2023 (Originally 02/25/2023)   Colonoscopy  03/19/2024 (Originally 02/19/2020)   Cervical Cancer Screening (HPV/Pap Cotest)  03/18/2027   DTaP/Tdap/Td (2 - Tdap) 03/17/2032   Hepatitis C Screening  Completed   HIV Screening  Completed   HPV VACCINES  Aged Out    Discussed health benefits of physical activity, and encouraged her to engage in regular exercise appropriate for her age and condition.  Problem List Items Addressed This Visit       Digestive   GERD (gastroesophageal reflux disease) Doing well with below, will continue.    Relevant Medications   omeprazole (PRILOSEC) 20 MG capsule   Other Visit Diagnoses     Annual physical exam    -  Primary   Relevant Orders   MM 3D SCREENING MAMMOGRAM BILATERAL BREAST   Hormone Panel   Lipid panel   Irregular menses     Will check hormone panel and obtain US if irregular bleeding persists. Will refer to GYN if warranted.    Relevant Orders   Hormone Panel   Encounter for screening mammogram for malignant neoplasm of breast       Relevant Orders   MM 3D SCREENING MAMMOGRAM BILATERAL BREAST   Screening for lipid disorders       Relevant Orders   Lipid panel      Return in about 1 year (around 03/19/2024), or if symptoms worsen or fail to improve, for CPE.     The above assessment and management plan was discussed with the patient. The  patient verbalized understanding of and has agreed to the management plan. Patient is aware to call the clinic if they develop any new symptoms or if symptoms fail to improve or worsen. Patient is aware when to return to the clinic for a follow-up visit. Patient educated on when it is appropriate to go to the emergency department.   Kari Baars, FNP-C [facility] [address] [place], [place] [address] [phone]

## 2023-04-02 LAB — STATUS REPORT

## 2023-04-03 ENCOUNTER — Other Ambulatory Visit: Payer: Self-pay | Admitting: *Deleted

## 2023-04-03 DIAGNOSIS — N926 Irregular menstruation, unspecified: Secondary | ICD-10-CM

## 2023-04-04 LAB — HORMONE PANEL (T4,TSH,FSH,TESTT,SHBG,DHEA,ETC)
DHEA-Sulfate, LCMS: 43 ug/dL
Estradiol, Serum, MS: 69 pg/mL
Estrone Sulfate: 14 ng/dL
Free Testosterone, Serum: 1 pg/mL — ABNORMAL LOW
Progesterone, Serum: 97 ng/dL
Sex Hormone Binding Globulin: 136 nmol/L — ABNORMAL HIGH
Testosterone, Serum (Total): 19 ng/dL
Testosterone-% Free: 0.5 %

## 2023-04-04 LAB — LIPID PANEL
Chol/HDL Ratio: 2.7 {ratio} (ref 0.0–4.4)
Cholesterol, Total: 163 mg/dL (ref 100–199)
HDL: 60 mg/dL (ref >39)
LDL Chol Calc (NIH): 87 mg/dL (ref 0–99)
Triglycerides: 88 mg/dL (ref 0–149)
VLDL Cholesterol Cal: 16 mg/dL (ref 5–40)

## 2023-04-06 ENCOUNTER — Other Ambulatory Visit: Payer: 59

## 2023-04-06 DIAGNOSIS — N926 Irregular menstruation, unspecified: Secondary | ICD-10-CM

## 2023-04-21 ENCOUNTER — Encounter: Payer: Self-pay | Admitting: Family Medicine

## 2023-04-24 LAB — HORMONE PANEL (T4,TSH,FSH,TESTT,SHBG,DHEA,ETC)
DHEA-Sulfate, LCMS: 38 ug/dL
Estradiol, Serum, MS: 270 pg/mL
Estrone Sulfate: 55 ng/dL
Follicle Stimulating Hormone: 9.4 m[IU]/mL
Free T-3: 2.9 pg/mL
Free Testosterone, Serum: 1 pg/mL — ABNORMAL LOW
Progesterone, Serum: 86 ng/dL
Sex Hormone Binding Globulin: 137 nmol/L — ABNORMAL HIGH
T4: 9 ug/dL
TSH: 3.2 uU/mL
Testosterone, Serum (Total): 16 ng/dL
Testosterone-% Free: 0.6 %
Triiodothyronine (T-3), Serum: 111 ng/dL

## 2023-05-04 ENCOUNTER — Ambulatory Visit
Admission: RE | Admit: 2023-05-04 | Discharge: 2023-05-04 | Disposition: A | Discharge status: Home / Self Care | Payer: 59 | Source: Ambulatory Visit | Attending: Family Medicine | Admitting: Family Medicine

## 2023-05-11 ENCOUNTER — Other Ambulatory Visit (HOSPITAL_COMMUNITY)
Admission: RE | Admit: 2023-05-11 | Discharge: 2023-05-11 | Disposition: A | Discharge status: Home / Self Care | Payer: 59 | Source: Ambulatory Visit | Attending: Obstetrics & Gynecology | Admitting: Obstetrics & Gynecology

## 2023-05-11 ENCOUNTER — Ambulatory Visit (INDEPENDENT_AMBULATORY_CARE_PROVIDER_SITE_OTHER): Payer: 59 | Admitting: Obstetrics & Gynecology

## 2023-05-11 ENCOUNTER — Encounter: Payer: Self-pay | Admitting: Obstetrics & Gynecology

## 2023-05-11 VITALS — BP 95/64 | HR 69 | Ht 63.0 in | Wt 121.2 lb

## 2023-05-11 DIAGNOSIS — N939 Abnormal uterine and vaginal bleeding, unspecified: Secondary | ICD-10-CM | POA: Insufficient documentation

## 2023-05-11 DIAGNOSIS — Z9851 Tubal ligation status: Secondary | ICD-10-CM | POA: Diagnosis not present

## 2023-05-11 NOTE — Progress Notes (Signed)
GYN VISIT Patient name: Sierra Williams MRN [id number]  Date of birth: Sep 12, 1975 Chief Complaint:   having irregular periods  History of Present Illness:   Sierra Williams is a 47 y.o. [phone] female being seen today for the following concerns:  -AUB: Starting to have periods twice per month since January- typically cycles are every 14-20 days.  Menses will last on average 1-2 heavy days with spotting for 3-4 days.  On a heavy day she may use a super tampon every 2 hours.  Typically 1-2 days prior to period will note considerable back pain and significant pelvic pressure.  Taking OTC medication with minimal relief.  Many years ago maybe tried COCs for a few mos with no improvement.  +constipation  Contraception: tubal ligation   Patient's last menstrual period was 04/21/2023.  Review of Systems:   Pertinent items are noted in HPI Denies fever/chills, dizziness, headaches, visual disturbances, fatigue, shortness of breath, chest pain, abdominal pain, vomiting, no problems with  bowel movements, urination, or intercourse unless otherwise stated above.  Pertinent History Reviewed:   Past Surgical History:  Procedure Laterality Date   BIOPSY  04/10/2018   Procedure: BIOPSY;  Surgeon: Corbin Ade, MD;  [facility];  Service: Endoscopy;;  esophagus @31 ,33   CHEST TUBE INSERTION     ESOPHAGOGASTRODUODENOSCOPY N/A 04/10/2018   Dr. Jena Gauss: Mild Schatzki ring in the mid esophagus status post dilation.  Salmon colored mucosa present circumferentially 3 cm above the GE junction.  Small hiatal hernia.  Biopsies consistent with mild chronic inflammation, reactive changes.  Intestinal metaplasia present, negative for dysplasia.  Gastric mucosa with mild inflammation.  Per Dr. Jena Gauss: Bx not impressive for diagnosis of Barrett's   left VATS for pneumothorax  2011   LUNG SURGERY Left 2011   MALONEY DILATION N/A 04/10/2018   Procedure: [name] DILATION;  Surgeon: Corbin Ade, MD;   [facility];  Service: Endoscopy;  Laterality: N/A;   TUBAL LIGATION     WRIST SURGERY Right     Past Medical History:  Diagnosis Date   Arthritis    Collapsed lung    Lupus 2004   Otitis media, serous, TM rupture    Reflux    Reviewed problem list, medications and allergies. Physical Assessment:   Vitals:   05/11/23 1116  BP: 95/64  Pulse: 69  Weight: 121 lb 3.2 oz (55 kg)  Height: 5\' 3"  (1.6 m)  Body mass index is 21.47 kg/m.       Physical Examination:   General appearance: alert, well appearing, and in no distress  Psych: mood appropriate, normal affect  Skin: warm & dry   Cardiovascular: normal heart rate noted  Respiratory: normal respiratory effort, no distress  Abdomen: soft, non-tender   Pelvic: VULVA: normal appearing vulva with no masses, tenderness or lesions, VAGINA: normal appearing vagina with normal color and discharge, no lesions, CERVIX: normal appearing cervix without discharge or lesions, UTERUS: uterus is normal size, shape, consistency and nontender, retroverted uterus ADNEXA: normal adnexa in size, nontender and no masses  Extremities: no edema   Chaperone: Malachy Mood    Endometrial Biopsy Procedure Note  Pre-operative Diagnosis: AUB  Post-operative Diagnosis: same  Procedure Details  The risks (including infection, bleeding, pain, and uterine perforation) and benefits of the procedure were explained to the patient and Written informed consent was obtained.  Antibiotic prophylaxis against endocarditis was not indicated.   The patient was placed in the dorsal lithotomy position.  Bimanual exam showed the uterus to be in the neutral position.  A speculum inserted in the vagina, and the cervix prepped with betadine.     A single tooth tenaculum was applied to the anterior lip of the cervix for stabilization.  Os finder was used.  A Pipelle endometrial aspirator was used to sample the endometrium.  Sample was sent for pathologic  examination.  Condition: Stable  Complications: None    Assessment & Plan:  1) AUB -Discussed workup including pelvic ultrasound and endometrial biopsy -Discussed change in menses likely due to perimenopausal/AUB-O state -Patient has no contraindications to any medical management options discussed COC's, Depo, patch, ring and LARCs -Also discussed surgical interventions including endometrial ablation and hysterectomy.  Reviewed risk benefits and indications including but not limited to risk of bleeding infection and injury to surrounding organs Discussed hospital expectations and recovery including pelvic rest -Next step pending results of pathology and [place] []  will call to make final decision about management  The patient was advised to call for any fever or for prolonged or severe pain or bleeding. She was advised to use OTC analgesics as needed for mild to moderate pain. She was advised to avoid vaginal intercourse for 48 hours or until the bleeding has completely stopped.   No orders of the defined types were placed in this encounter.   Return for pelvic US to be scheduled.   Myna Hidalgo, DO Attending Obstetrician & Gynecologist, [facility] for [employer], [facility]

## 2023-05-14 LAB — SURGICAL PATHOLOGY

## 2023-05-29 ENCOUNTER — Other Ambulatory Visit: Payer: 59 | Admitting: Radiology

## 2023-05-30 ENCOUNTER — Encounter: Payer: Self-pay | Admitting: Obstetrics & Gynecology

## 2023-05-30 ENCOUNTER — Ambulatory Visit: Payer: 59 | Admitting: Obstetrics & Gynecology

## 2023-05-30 ENCOUNTER — Ambulatory Visit: Payer: 59 | Admitting: Radiology

## 2023-05-30 VITALS — BP 113/66 | HR 69 | Ht 63.0 in | Wt 119.8 lb

## 2023-05-30 DIAGNOSIS — Z9851 Tubal ligation status: Secondary | ICD-10-CM

## 2023-05-30 DIAGNOSIS — Z30430 Encounter for insertion of intrauterine contraceptive device: Secondary | ICD-10-CM | POA: Diagnosis not present

## 2023-05-30 DIAGNOSIS — N939 Abnormal uterine and vaginal bleeding, unspecified: Secondary | ICD-10-CM

## 2023-05-30 MED ORDER — LEVONORGESTREL 20 MCG/DAY IU IUD
1.0000 | INTRAUTERINE_SYSTEM | Freq: Once | INTRAUTERINE | Status: AC
Start: 2023-05-30 — End: 2023-05-30
  Administered 2023-05-30: 1 via INTRAUTERINE

## 2023-05-30 NOTE — Progress Notes (Signed)
Anteverted midplane uterus normal in size  with symmetrical myometrial walls Single tiny intramural fibroid seen = 12 x 11 mm EEC = 12.8 mm  avascular homogeneous cavity without evidence for soft tissue focal abnormality           Normal Rt Ov, mobile    Left ovary is mobile,  seen with two well circumscribed cysts, avascular with thin internal septations = 19 x 18 mm and the second cyst = 24 x 19 mm has inhomogeneous low level reticular echopattern suggestive of hemorrhagic cysts Neg adnexal regions   -   neg cds  -  no free fluid present

## 2023-05-30 NOTE — Progress Notes (Signed)
GYN VISIT Patient name: Sierra Williams MRN [id number]  Date of birth: 10-02-1975 Chief Complaint:   Follow-up  History of Present Illness:   Sierra Williams is a 47 y.o. [phone] female being seen today for the following concerns:  AUB: In review this has been an ongoing issue since last January 2024.  Menses will last about 4 days with 2 heavy days using a super tampon every 2 hours.  Additionally she will have a period every 14 to 20 days.  Today she notes no changes since her last visit.  Period started 2 days after her last appt.  She presents today to review Sierra Williams results and management.    Pelvic Sierra Williams today: Anteverted midplane uterus normal in size  with symmetrical myometrial walls Single tiny intramural fibroid seen = 12 x 11 mm EEC = 12.8 mm  avascular homogeneous cavity without evidence for soft tissue focal abnormality           Normal Rt Ov, mobile    Left ovary is mobile,  seen with two well circumscribed cysts, avascular with thin internal septations = 19 x 18 mm and the second cyst = 24 x 19 mm has inhomogeneous low level reticular echopattern suggestive of hemorrhagic cysts Neg adnexal regions   -   neg cds  -  no free fluid present   Patient's last menstrual period was 04/21/2023.    Review of Systems:   Pertinent items are noted in HPI Denies fever/chills, dizziness, headaches, visual disturbances, fatigue, shortness of breath, chest pain, abdominal pain, vomiting Pertinent History Reviewed:   Past Surgical History:  Procedure Laterality Date   BIOPSY  04/10/2018   Procedure: BIOPSY;  Surgeon: Corbin Ade, MD;  [facility];  Service: Endoscopy;;  esophagus @31 ,33   CHEST TUBE INSERTION     ESOPHAGOGASTRODUODENOSCOPY N/A 04/10/2018   Dr. Jena Gauss: Mild Schatzki ring in the mid esophagus status post dilation.  Salmon colored mucosa present circumferentially 3 cm above the GE junction.  Small hiatal hernia.  Biopsies consistent with mild chronic  inflammation, reactive changes.  Intestinal metaplasia present, negative for dysplasia.  Gastric mucosa with mild inflammation.  Per Dr. Jena Gauss: Bx not impressive for diagnosis of Barrett's   left VATS for pneumothorax  2011   LUNG SURGERY Left 2011   MALONEY DILATION N/A 04/10/2018   Procedure: [name] DILATION;  Surgeon: Corbin Ade, MD;  [facility];  Service: Endoscopy;  Laterality: N/A;   TUBAL LIGATION     WRIST SURGERY Right     Past Medical History:  Diagnosis Date   Arthritis    Collapsed lung    Lupus 2004   Otitis media, serous, TM rupture    Reflux    Reviewed problem list, medications and allergies. Physical Assessment:   Vitals:   05/30/23 1113  BP: 113/66  Pulse: 69  Weight: 119 lb 12.8 oz (54.3 kg)  Height: 5\' 3"  (1.6 m)  Body mass index is 21.22 kg/m.       Physical Examination:   General appearance: alert, well appearing, and in no distress  Psych: mood appropriate, normal affect  Skin: warm & dry   Cardiovascular: normal heart rate noted  Respiratory: normal respiratory effort, no distress  Abdomen: soft, non-tender   Pelvic: VULVA: normal appearing vulva with no masses, tenderness or lesions, VAGINA: normal appearing vagina with normal color and discharge, no lesions, CERVIX: normal appearing cervix without discharge or lesions  Extremities: no edema  Chaperone: Sierra Williams    IUD INSERTION   The risks and benefits of the method and placement have been thouroughly reviewed with the patient and all questions were answered.  Specifically the patient is aware of failure rate of 06/998, expulsion of the IUD and of possible perforation.  The patient is aware of irregular bleeding due to the method and understands the incidence of irregular bleeding diminishes with time.  Signed copy of informed consent in chart.    A sterile speculum was placed in the vagina.  The cervix was visualized, prepped using Betadine, and grasped with a single  tooth tenaculum. The uterus was sounded to 7 cm.  Mirena  IUD placed per manufacturer's recommendations. The strings were trimmed to approximately 3 cm. The patient tolerated the procedure well.    Chaperone: Sierra Williams     Assessment & Plan:  1) AUB, Mirena insertion -reviewed Sierra Williams findings- small fibroid, unlikely to be source of bleeding issues.  Two small physiologic/hemorrhagic cysts noted- normal finding.  Otherwise no acute abnormalities noted.  AUB likely due to perimenopausal status -Briefly reviewed all management options such as POPS, Depo LARCs or surgical intervention -Patient wishes to proceed with Mirena, informed consent obtained as above and device placed The patient was given post procedure instructions, including signs and symptoms of infection and to check for the strings after each menses or each month, and refraining from intercourse or anything in the vagina for the next week -She was given a care card with date IUD placed, and date IUD to be removed. She is scheduled for a f/u appointment in  6- 8 weeks.   No orders of the defined types were placed in this encounter.   Return in about 6 weeks (around 07/11/2023) for 6-8wk IUD check.   Sierra Hidalgo, DO Attending Obstetrician & Gynecologist, [facility] for [employer], [facility]

## 2023-07-11 ENCOUNTER — Ambulatory Visit: Payer: 59 | Admitting: Obstetrics & Gynecology

## 2023-07-11 ENCOUNTER — Encounter: Payer: Self-pay | Admitting: Obstetrics & Gynecology

## 2023-07-11 VITALS — BP 110/66 | HR 74 | Ht 63.0 in | Wt 124.8 lb

## 2023-07-11 DIAGNOSIS — Z30431 Encounter for routine checking of intrauterine contraceptive device: Secondary | ICD-10-CM

## 2023-07-11 DIAGNOSIS — N939 Abnormal uterine and vaginal bleeding, unspecified: Secondary | ICD-10-CM

## 2023-07-11 NOTE — Progress Notes (Signed)
 GYN VISIT Patient name: Sierra Williams MRN [id number]  Date of birth: [birth year] Chief Complaint:   Follow-up (IUD check)  History of Present Illness:   Sierra Williams is a 48 y.o. [phone] female being seen today for the following:  Mirena  placed 05/30/23 for management of AUB.  Today she notes   Previously reported-menses every 14 to 20 days lasting for about 4 days with 2 heavy days using a super tampon every 2 hours  US  completed 05/2023: 7.86 x 5.04 x 6.59 cm, Total uterine volume 136.74 cc single intramural fibroid less than 1 cm size  Since Dec 16th has noted irregular bleeding.  Did pass large clot in December, but no further clots.  On some days will require a pad, a little bit more than just a pantyliner.  She otherwise reports no acute complaints or concerns.   Review of Systems:   Pertinent items are noted in HPI Denies fever/chills, dizziness, headaches, visual disturbances, fatigue, shortness of breath, chest pain, abdominal pain, vomiting. Pertinent History Reviewed:   Past Surgical History:  Procedure Laterality Date   BIOPSY  04/10/2018   Procedure: BIOPSY;  Surgeon: Suzette Espy, MD;  [facility];  Service: Endoscopy;;  esophagus @31 ,33   CHEST TUBE INSERTION     ESOPHAGOGASTRODUODENOSCOPY N/A 04/10/2018   Dr. Riley Cheadle: Mild Schatzki ring in the mid esophagus status post dilation.  Salmon colored mucosa present circumferentially 3 cm above the GE junction.  Small hiatal hernia.  Biopsies consistent with mild chronic inflammation, reactive changes.  Intestinal metaplasia present, negative for dysplasia.  Gastric mucosa with mild inflammation.  Per Dr. Riley Cheadle: Bx not impressive for diagnosis of Barrett's   left VATS for pneumothorax  2011   LUNG SURGERY Left 2011   MALONEY DILATION N/A 04/10/2018   Procedure: Londa Rival DILATION;  Surgeon: Suzette Espy, MD;  [facility];  Service: Endoscopy;  Laterality: N/A;   TUBAL LIGATION     WRIST  SURGERY Right     Past Medical History:  Diagnosis Date   Arthritis    Collapsed lung    Lupus 2004   Otitis media, serous, TM rupture    Reflux    Reviewed problem list, medications and allergies. Physical Assessment:   Vitals:   07/11/23 1553  BP: 110/66  Pulse: 74  Weight: 124 lb 12.8 oz (56.6 kg)  Height: 5\' 3"  (1.6 m)  Body mass index is 22.11 kg/m.       Physical Examination:   General appearance: alert, well appearing, and in no distress  Psych: mood appropriate, normal affect  Skin: warm & dry   Cardiovascular: normal heart rate noted  Respiratory: normal respiratory effort, no distress  Abdomen: soft, non-tender   Pelvic: VULVA: normal appearing vulva with no masses, tenderness or lesions, VAGINA: normal appearing vagina with normal color and discharge, no lesions, CERVIX: normal appearing cervix without discharge or lesions,strings visualized  Extremities: no edema   Chaperone:  pt declined     Assessment & Plan:  1) AUB/IUD check up -device in proper location -reassured pt that irregular light spotting is normal and anticipate resolution of this bleeding over the next couple of mos -should she not have improvement of symptoms RTC to review management options -plan to f/u in 58yr for annual   No orders of the defined types were placed in this encounter.   Return in about 1 year (around 07/10/2024) for Annual.   Christinamarie Tall, DO Attending Obstetrician &  Gynecologist, Biochemist, clinical for [employer], [facility]

## 2024-02-27 ENCOUNTER — Encounter: Payer: Self-pay | Admitting: *Deleted

## 2024-03-06 LAB — LAB REPORT - SCANNED
Creatinine, POC: 34.3 mg/dL
EGFR: 74

## 2024-03-21 ENCOUNTER — Encounter: Payer: 59 | Admitting: Family Medicine

## 2024-04-01 ENCOUNTER — Other Ambulatory Visit: Payer: Self-pay | Admitting: Family Medicine

## 2024-04-01 DIAGNOSIS — Z1231 Encounter for screening mammogram for malignant neoplasm of breast: Secondary | ICD-10-CM

## 2024-04-04 ENCOUNTER — Encounter: Payer: Self-pay | Admitting: Family Medicine

## 2024-04-04 ENCOUNTER — Ambulatory Visit: Admitting: Family Medicine

## 2024-04-04 VITALS — BP 120/74 | Temp 98.1°F | Ht 63.0 in | Wt 124.4 lb

## 2024-04-04 DIAGNOSIS — Z0001 Encounter for general adult medical examination with abnormal findings: Secondary | ICD-10-CM | POA: Diagnosis not present

## 2024-04-04 DIAGNOSIS — Z0000 Encounter for general adult medical examination without abnormal findings: Secondary | ICD-10-CM

## 2024-04-04 DIAGNOSIS — Z23 Encounter for immunization: Secondary | ICD-10-CM | POA: Diagnosis not present

## 2024-04-04 DIAGNOSIS — Z13220 Encounter for screening for lipoid disorders: Secondary | ICD-10-CM

## 2024-04-04 DIAGNOSIS — K219 Gastro-esophageal reflux disease without esophagitis: Secondary | ICD-10-CM | POA: Diagnosis not present

## 2024-04-04 DIAGNOSIS — Z130 Encounter for screening for diseases of the blood and blood-forming organs and certain disorders involving the immune mechanism: Secondary | ICD-10-CM

## 2024-04-04 DIAGNOSIS — I7300 Raynaud's syndrome without gangrene: Secondary | ICD-10-CM | POA: Insufficient documentation

## 2024-04-04 MED ORDER — OMEPRAZOLE 20 MG PO CPDR: 20.0000 mg | DELAYED_RELEASE_CAPSULE | Freq: Every day | ORAL | 4 refills | Status: AC

## 2024-04-04 NOTE — Progress Notes (Signed)
 Complete physical exam  Patient: Sierra Williams   DOB: [birth year]   48 y.o. Female  MRN: [id number]  Subjective:    Chief Complaint  Patient presents with   Annual Exam    Sierra Williams is a 48 y.o. female who presents today for a complete physical exam. She reports consuming a general diet. The patient does not participate in regular exercise at present. She generally feels well. She reports sleeping well. She does not have additional problems to discuss today.   She manages rheumatoid arthritis and lupus with Plaquenil, with no recent changes in her medication regimen. She denies recent infections. She experiences significant symptoms of Raynaud's syndrome, particularly affecting her hands and feet in cold environments, and uses gloves for protection. She is not currently on medication for Raynaud's.  She underwent an IUD procedure, which is working well with only occasional spotting. She plans to follow up in January.  She takes omeprazole  for gastroesophageal reflux disease, with no reported changes in symptoms. No changes in bowel or bladder habits and no swelling in her legs or feet.  She has increased sensitivity to the sun, with dryness around her hairline and face, but no rash. She reports having annual vision screenings due to Plaquenil use and has scheduled a mammogram for next month. Her Cologuard screening is up to date and not due until next year.       Most recent fall risk assessment:    04/04/2024    3:41 PM  Fall Risk   Falls in the past year? 0  Risk for fall due to : No Fall Risks  Follow up Falls evaluation completed     Most recent depression screenings:    04/04/2024    3:42 PM 05/11/2023   11:30 AM  PHQ 2/9 Scores  PHQ - 2 Score 1 1  PHQ- 9 Score 4 5    Vision:Within last year and Dental: No current dental problems and Receives regular dental care  Patient Active Problem List   Diagnosis Date Noted   Raynaud's disease 04/04/2024   Disorder  of lumbosacral intervertebral disc 03/16/2021   Chronic low back pain 03/16/2021   RA (rheumatoid arthritis) (HCC) 03/16/2021   Systemic lupus erythematosus (HCC) 03/15/2019   GERD (gastroesophageal reflux disease) 03/01/2018   Thyroid  nodule 01/21/2018   Past Medical History:  Diagnosis Date   Arthritis    Collapsed lung    Lupus 2004   Otitis media, serous, TM rupture    Reflux    Past Surgical History:  Procedure Laterality Date   BIOPSY  04/10/2018   Procedure: BIOPSY;  Surgeon: Shaaron Lamar HERO, MD;  [facility];  Service: Endoscopy;;  esophagus @31 ,33   CHEST TUBE INSERTION     ESOPHAGOGASTRODUODENOSCOPY N/A 04/10/2018   Dr. Shaaron: Mild Schatzki ring in the mid esophagus status post dilation.  Salmon colored mucosa present circumferentially 3 cm above the GE junction.  Small hiatal hernia.  Biopsies consistent with mild chronic inflammation, reactive changes.  Intestinal metaplasia present, negative for dysplasia.  Gastric mucosa with mild inflammation.  Per Dr. Shaaron: Bx not impressive for diagnosis of Barrett's   left VATS for pneumothorax  2011   LUNG SURGERY Left 2011   MALONEY DILATION N/A 04/10/2018   Procedure: AGAPITO DILATION;  Surgeon: Shaaron Lamar HERO, MD;  [facility]: [facility];  Service: Endoscopy;  Laterality: N/A;   TUBAL LIGATION     WRIST SURGERY Right    Social History  Tobacco Use   Smoking status: Former   Smokeless tobacco: Former    Quit date: 03/27/2010  Vaping Use   Vaping status: Never Used  Substance Use Topics   Alcohol use: Yes    Comment: occas   Drug use: Never   Social History   Socioeconomic History   Marital status: Married    Spouse name: Not on file   Number of children: Not on file   Years of education: Not on file   Highest education level: Not on file  Occupational History   Not on file  Tobacco Use   Smoking status: Former   Smokeless tobacco: Former    Quit date: 03/27/2010  Vaping Use   Vaping status:  Never Used  Substance and Sexual Activity   Alcohol use: Yes    Comment: occas   Drug use: Never   Sexual activity: Yes    Birth control/protection: Surgical    Comment: tubal  Other Topics Concern   Not on file  Social History Narrative   Not on file   Social Drivers of Health   Financial Resource Strain: Low Risk  (05/11/2023)   Overall Financial Resource Strain (CARDIA)    Difficulty of Paying Living Expenses: Not hard at all  Food Insecurity: No Food Insecurity (05/11/2023)   Hunger Vital Sign    Worried About Running Out of Food in the Last Year: Never true    Ran Out of Food in the Last Year: Never true  Transportation Needs: No Transportation Needs (05/11/2023)   PRAPARE - Administrator, Civil Service (Medical): No    Lack of Transportation (Non-Medical): No  Physical Activity: Insufficiently Active (05/11/2023)   Exercise Vital Sign    Days of Exercise per Week: 3 days    Minutes of Exercise per Session: 10 min  Stress: No Stress Concern Present (05/11/2023)   [employer] of Occupational Health - Occupational Stress Questionnaire    Feeling of Stress : Not at all  Social Connections: Moderately Integrated (05/11/2023)   Social Connection and Isolation Panel    Frequency of Communication with Friends and Family: Three times a week    Frequency of Social Gatherings with Friends and Family: Twice a week    Attends Religious Services: 1 to 4 times per year    Active Member of [employer] or Organizations: No    Attends Banker Meetings: Never    Marital Status: Married  Catering manager Violence: Not At Risk (05/11/2023)   Humiliation, Afraid, Rape, and Kick questionnaire    Fear of Current or Ex-Partner: No    Emotionally Abused: No    Physically Abused: No    Sexually Abused: No   Family Status  Relation Name Status   PGF  Deceased   PGM  Deceased   MGM  Deceased   MGF  Deceased   Father  Alive   Mother  Alive   Brother  Alive    Brother  Alive   Brother  Manufacturing engineer  (Not Specified)   Other maternal grandmother (Not Specified)   Other paternal grandfather (Not Specified)   Nutritional therapist  Deceased   Daughter  Alive   Daughter  Alive   Son  Alive   Neg Hx  (Not Specified)  No partnership data on file   Family History  Problem Relation Age of Onset   Heart attack Paternal Grandfather    Hypertension Paternal Grandfather    Hyperlipidemia Paternal  Grandfather    Hypertension Paternal Grandmother    Hyperlipidemia Paternal Grandmother    Hypertension Maternal Grandmother    Hyperlipidemia Maternal Grandmother    Heart attack Maternal Grandfather    Hypertension Maternal Grandfather    Hyperlipidemia Maternal Grandfather    Cancer Other        lung   Cancer Other        unknown primary   Melanoma Paternal Uncle    Colon cancer Neg Hx    No Known Allergies    Patient Care Team: Burlie Cajamarca, Rock HERO, FNP as PCP - General (Family Medicine)   Outpatient Medications Prior to Visit  Medication Sig   hydroxychloroquine (PLAQUENIL) 200 MG tablet Take by mouth daily.   [DISCONTINUED] omeprazole  (PRILOSEC) 20 MG capsule Take 1 capsule (20 mg total) by mouth daily.   No facility-administered medications prior to visit.    Review of Systems  Cardiovascular:        Coldness and color changes to hands and feet in cold environments  Skin:  Positive for rash (dry, flaking around nose and hairline).  All other systems reviewed and are negative.         Objective:     BP 120/74   Temp 98.1 F (36.7 C)   Ht 5' 3 (1.6 m)   Wt 124 lb 6.4 oz (56.4 kg)   SpO2 91%   BMI 22.04 kg/m  BP Readings from Last 3 Encounters:  04/04/24 120/74  07/11/23 110/66  05/30/23 113/66   Wt Readings from Last 3 Encounters:  04/04/24 124 lb 6.4 oz (56.4 kg)  07/11/23 124 lb 12.8 oz (56.6 kg)  05/30/23 119 lb 12.8 oz (54.3 kg)   SpO2 Readings from Last 3 Encounters:  04/04/24 91%  03/20/23 99%  03/17/22 99%       Physical Exam Vitals and nursing note reviewed.  Constitutional:      General: She is not in acute distress.    Appearance: Normal appearance. She is well-developed and well-groomed. She is not ill-appearing, toxic-appearing or diaphoretic.  HENT:     Head: Normocephalic and atraumatic.     Jaw: There is normal jaw occlusion.     Right Ear: Hearing, tympanic membrane, ear canal and external ear normal.     Left Ear: Hearing, tympanic membrane, ear canal and external ear normal.     Nose: Nose normal.     Mouth/Throat:     Lips: Pink.     Mouth: Mucous membranes are moist.     Pharynx: Oropharynx is clear. Uvula midline.  Eyes:     General: Lids are normal.     Extraocular Movements: Extraocular movements intact.     Conjunctiva/sclera: Conjunctivae normal.     Pupils: Pupils are equal, round, and reactive to light.  Neck:     Thyroid : No thyroid  mass, thyromegaly or thyroid  tenderness.     Vascular: No carotid bruit or JVD.     Trachea: Trachea and phonation normal.  Cardiovascular:     Rate and Rhythm: Normal rate and regular rhythm.     Chest Wall: PMI is not displaced.     Pulses: Normal pulses.     Heart sounds: Normal heart sounds. No murmur heard.    No friction rub. No gallop.  Pulmonary:     Effort: Pulmonary effort is normal. No respiratory distress.     Breath sounds: Normal breath sounds. No wheezing.  Abdominal:     General: Bowel sounds are normal. There  is no distension or abdominal bruit.     Palpations: Abdomen is soft. There is no hepatomegaly or splenomegaly.     Tenderness: There is no abdominal tenderness. There is no right CVA tenderness or left CVA tenderness.     Hernia: No hernia is present.  Musculoskeletal:        General: Normal range of motion.     Cervical back: Normal range of motion and neck supple.     Right lower leg: No edema.     Left lower leg: No edema.  Lymphadenopathy:     Cervical: No cervical adenopathy.  Skin:    General:  Skin is warm and dry.     Capillary Refill: Capillary refill takes less than 2 seconds.     Coloration: Skin is not cyanotic, jaundiced or pale.     Findings: Rash present.     Comments: Dry, flaking rash to nasolabial folds  Neurological:     General: No focal deficit present.     Mental Status: She is alert and oriented to person, place, and time.     Sensory: Sensation is intact.     Motor: Motor function is intact.     Coordination: Coordination is intact.     Gait: Gait is intact.     Deep Tendon Reflexes: Reflexes are normal and symmetric.  Psychiatric:        Attention and Perception: Attention and perception normal.        Mood and Affect: Mood and affect normal.        Speech: Speech normal.        Behavior: Behavior normal. Behavior is cooperative.        Thought Content: Thought content normal.        Cognition and Memory: Cognition and memory normal.        Judgment: Judgment normal.       Last CBC Lab Results  Component Value Date   WBC 6.5 03/16/2021   HGB 12.1 03/16/2021   HCT 37.5 03/16/2021   MCV 83 03/16/2021   MCH 26.8 03/16/2021   RDW 14.9 03/16/2021   PLT 296 03/16/2021   Last metabolic panel Lab Results  Component Value Date   GLUCOSE 95 03/16/2021   NA 139 03/16/2021   K 4.0 03/16/2021   CL 101 03/16/2021   CO2 22 03/16/2021   BUN 11 03/16/2021   CREATININE 0.77 03/16/2021   EGFR 74.0 03/05/2024   CALCIUM 8.6 (L) 03/16/2021   PROT 6.8 03/16/2021   ALBUMIN 4.2 03/16/2021   LABGLOB 2.6 03/16/2021   AGRATIO 1.6 03/16/2021   BILITOT <0.2 03/16/2021   ALKPHOS 72 03/16/2021   AST 14 03/16/2021   ALT 11 03/16/2021   Last lipids Lab Results  Component Value Date   CHOL 163 03/20/2023   HDL 60 03/20/2023   LDLCALC 87 03/20/2023   TRIG 88 03/20/2023   CHOLHDL 2.7 03/20/2023    Last thyroid  functions Lab Results  Component Value Date   TSH 3.110 03/17/2022   T4TOTAL 8.7 03/17/2022   Last vitamin D  Lab Results  Component Value Date    VD25OH 44.2 01/21/2018      Assessment & Plan:    Routine Health Maintenance and Physical Exam  Immunization History  Administered Date(s) Administered   Influenza Inj Mdck Quad Pf 03/24/2017, 03/11/2019   Influenza Inj Mdck Quad With Preservative 04/22/2018   Influenza, Seasonal, Injecte, Preservative Fre 03/20/2023, 04/04/2024   Influenza,inj,Quad PF,6+ Mos 03/15/2020, 03/16/2021, 03/17/2022  Janssen (J&J) SARS-COV-2 Vaccination 11/10/2019   Td 03/17/2022    Health Maintenance  Topic Date Due   COVID-19 Vaccine (2 - 2025-26 season) 04/20/2024 (Originally 02/25/2024)   Hepatitis B Vaccines 19-59 Average Risk (1 of 3 - 19+ 3-dose series) 04/04/2025 (Originally 02/18/1994)   Mammogram  05/03/2025   Fecal DNA (Cologuard)  05/14/2025   Cervical Cancer Screening (HPV/Pap Cotest)  03/18/2027   DTaP/Tdap/Td (2 - Tdap) 03/17/2032   Influenza Vaccine  Completed   Hepatitis C Screening  Completed   HIV Screening  Completed   Pneumococcal Vaccine  Aged Out   HPV VACCINES  Aged Out   Meningococcal B Vaccine  Aged Out    Discussed health benefits of physical activity, and encouraged her to engage in regular exercise appropriate for her age and condition.  Problem List Items Addressed This Visit       Digestive   GERD (gastroesophageal reflux disease)   Relevant Medications   omeprazole  (PRILOSEC) 20 MG capsule   Other Visit Diagnoses       Annual physical exam    -  Primary   Relevant Orders   Lipid panel     Screening for lipid disorders       Relevant Orders   Lipid panel     Screening for endocrine, nutritional, metabolic and immunity disorder       Relevant Orders   Lipid panel   TSH   T4, Free     Encounter for immunization       Relevant Orders   Flu vaccine trivalent PF, 6mos and older(Flulaval,Afluria,Fluarix,Fluzone) (Completed)        Systemic lupus erythematosus Increased sun sensitivity with dryness in the hairline and facial area. No significant rash  reported. - Use Equate eczema lotion for dry skin - Continue regular follow-up with rheumatology  Rheumatoid arthritis Well-managed with current medication regimen. Annual vision screening completed due to Plaquenil use. - Continue current medication regimen - Ensure annual vision screening due to Plaquenil use  Raynaud's syndrome Significant symptoms in hands and feet, exacerbated by cold environments. Prefers non-pharmacological management initially. - Try handheld heating device and rechargeable heated gloves - Consider low-dose Norvasc if symptoms worsen  Gastroesophageal reflux disease Managed with omeprazole . No changes in symptoms reported. - Refill omeprazole  prescription       Return in about 1 year (around 04/04/2025) for Annual Physical.     Rosaline Bruns, FNP

## 2024-04-05 LAB — LIPID PANEL
Chol/HDL Ratio: 3.5 ratio (ref 0.0–4.4)
Cholesterol, Total: 167 mg/dL (ref 100–199)
HDL: 48 mg/dL (ref >39)
LDL Chol Calc (NIH): 104 mg/dL — ABNORMAL HIGH (ref 0–99)
Triglycerides: 82 mg/dL (ref 0–149)
VLDL Cholesterol Cal: 15 mg/dL (ref 5–40)

## 2024-04-05 LAB — T4, FREE: Free T4: 1.07 ng/dL (ref 0.82–1.77)

## 2024-04-05 LAB — TSH: TSH: 2.8 u[IU]/mL (ref 0.450–4.500)

## 2024-04-07 ENCOUNTER — Ambulatory Visit: Payer: Self-pay | Admitting: Family Medicine

## 2024-05-06 ENCOUNTER — Ambulatory Visit
Admission: RE | Admit: 2024-05-06 | Discharge: 2024-05-06 | Disposition: A | Discharge status: Home / Self Care | Source: Ambulatory Visit | Attending: Family Medicine | Admitting: Family Medicine

## 2024-05-06 DIAGNOSIS — Z1231 Encounter for screening mammogram for malignant neoplasm of breast: Secondary | ICD-10-CM

## 2024-05-08 ENCOUNTER — Ambulatory Visit: Payer: Self-pay | Admitting: Family Medicine

## 2024-07-31 ENCOUNTER — Telehealth: Admitting: Physician Assistant

## 2024-07-31 DIAGNOSIS — B029 Zoster without complications: Secondary | ICD-10-CM

## 2024-07-31 MED ORDER — GABAPENTIN 300 MG PO CAPS: 300.0000 mg | ORAL_CAPSULE | Freq: Two times a day (BID) | ORAL | 0 refills | Status: AC | PRN

## 2024-07-31 MED ORDER — VALACYCLOVIR HCL 1 G PO TABS: 1000.0000 mg | ORAL_TABLET | Freq: Three times a day (TID) | ORAL | 0 refills | Status: AC

## 2024-07-31 NOTE — Progress Notes (Signed)
E-visit for Shingles   We are sorry that you are not feeling well. Here is how we plan to help!  Based on what you shared with me it looks like you have shingles.  Shingles or herpes zoster, is a common infection of the nerves.  It is a painful rash caused by the herpes zoster virus.  This is the same virus that causes chickenpox.  After a person has chickenpox, the virus remains inactive in the nerve cells.  Years later, the virus can become active again and travel to the skin.  It typically will appear on one side of the face or body.  Burning or shooting pain, tingling, or itching are early signs of the infection.  Blisters typically scab over in 7 to 10 days and clear up within 2-4 weeks. Shingles is only contagious to people that have never had the chickenpox, the chickenpox vaccine, or anyone who has a compromised immune system.  You should avoid contact with these type of people until your blisters scab over.  I have prescribed Valacyclovir 1g three times daily for 7 days and also Gabapentin 300mg  twice daily as needed for pain   HOME CARE: Apply ice packs (wrapped in a thin towel), cool compresses, or soak in cool bath to help reduce pain. Use calamine lotion to calm itchy skin. Avoid scratching the rash. Avoid direct sunlight.  GET HELP RIGHT AWAY IF: Symptoms that don't away after treatment. A rash or blisters near your eye. Increased drainage, fever, or rash after treatment. Severe pain that doesn't go away.   MAKE SURE YOU   Understand these instructions. Will watch your condition. Will get help right away if you are not doing well or get worse.  Thank you for choosing an e-visit.  Your e-visit answers were reviewed by a board certified advanced clinical practitioner to complete your personal care plan. Depending upon the condition, your plan could have included both over the counter or prescription medications.  Please review your pharmacy choice. Make sure the pharmacy  is open so you can pick up prescription now. If there is a problem, you may contact your provider through and have the prescription routed to another pharmacy.  Your safety is important to [employer]. If you have drug allergies check your prescription carefully.   For the next 24 hours you can use MyChart to ask questions about today's visit, request a non-urgent call back, or ask for a work or school excuse. You will get an email in the next two days asking about your experience. I hope that your e-visit has been valuable and will speed your recovery.  I have spent 5 minutes in review of e-visit questionnaire, review and updating patient chart, medical decision making and response to patient.   [place], PhD, FNP-BC

## 2025-04-07 ENCOUNTER — Encounter: Payer: Self-pay | Admitting: Family Medicine
